# Patient Record
Sex: Female | Born: 1935 | Race: White | Hispanic: No | Smoking: Former smoker
Health system: Southern US, Community
[De-identification: ages and names within clinical notes are randomized; demographics above are authoritative.]

## PROBLEM LIST (undated history)

## (undated) DIAGNOSIS — F32A Depression, unspecified: Secondary | ICD-10-CM

## (undated) DIAGNOSIS — I10 Essential (primary) hypertension: Secondary | ICD-10-CM

## (undated) DIAGNOSIS — F329 Major depressive disorder, single episode, unspecified: Secondary | ICD-10-CM

## (undated) DIAGNOSIS — F0390 Unspecified dementia without behavioral disturbance: Secondary | ICD-10-CM

## (undated) DIAGNOSIS — M810 Age-related osteoporosis without current pathological fracture: Secondary | ICD-10-CM

## (undated) HISTORY — PX: FRACTURE SURGERY: SHX138

## (undated) HISTORY — PX: OTHER SURGICAL HISTORY: SHX169

---

## 2004-01-24 ENCOUNTER — Ambulatory Visit (HOSPITAL_COMMUNITY): Admission: RE | Admit: 2004-01-24 | Discharge: 2004-01-24 | Payer: Self-pay | Admitting: Ophthalmology

## 2010-10-19 ENCOUNTER — Other Ambulatory Visit: Payer: Self-pay | Admitting: Orthopedic Surgery

## 2010-10-19 DIAGNOSIS — M48061 Spinal stenosis, lumbar region without neurogenic claudication: Secondary | ICD-10-CM

## 2010-10-24 ENCOUNTER — Ambulatory Visit
Admission: RE | Admit: 2010-10-24 | Discharge: 2010-10-24 | Disposition: A | Discharge status: Home / Self Care | Payer: Medicare Other | Source: Ambulatory Visit | Attending: Orthopedic Surgery | Admitting: Orthopedic Surgery

## 2010-10-24 DIAGNOSIS — M48061 Spinal stenosis, lumbar region without neurogenic claudication: Secondary | ICD-10-CM

## 2011-01-02 ENCOUNTER — Other Ambulatory Visit: Payer: Self-pay | Admitting: Orthopedic Surgery

## 2011-01-23 ENCOUNTER — Inpatient Hospital Stay: Admission: RE | Admit: 2011-01-23 | Payer: Medicare Other | Source: Ambulatory Visit

## 2011-02-26 ENCOUNTER — Encounter (HOSPITAL_COMMUNITY)
Admission: RE | Disposition: A | Discharge status: Home / Self Care | Payer: Self-pay | Source: Ambulatory Visit | Attending: Ophthalmology

## 2011-02-26 ENCOUNTER — Encounter (HOSPITAL_COMMUNITY): Payer: Self-pay | Admitting: *Deleted

## 2011-02-26 ENCOUNTER — Ambulatory Visit (HOSPITAL_COMMUNITY)
Admission: RE | Admit: 2011-02-26 | Discharge: 2011-02-26 | Disposition: A | Discharge status: Home / Self Care | Payer: Medicare Other | Source: Ambulatory Visit | Attending: Ophthalmology | Admitting: Ophthalmology

## 2011-02-26 DIAGNOSIS — H26499 Other secondary cataract, unspecified eye: Secondary | ICD-10-CM | POA: Insufficient documentation

## 2011-02-26 DIAGNOSIS — Z79899 Other long term (current) drug therapy: Secondary | ICD-10-CM | POA: Insufficient documentation

## 2011-02-26 HISTORY — DX: Essential (primary) hypertension: I10

## 2011-02-26 HISTORY — DX: Major depressive disorder, single episode, unspecified: F32.9

## 2011-02-26 HISTORY — DX: Depression, unspecified: F32.A

## 2011-02-26 SURGERY — TREATMENT, USING YAG LASER
Anesthesia: LOCAL | Laterality: Left

## 2011-02-26 MED ORDER — TETRACAINE HCL 0.5 % OP SOLN
OPHTHALMIC | Status: AC
  Administered 2011-02-26: 1 [drp] via OPHTHALMIC
  Filled 2011-02-26: qty 2

## 2011-02-26 MED ORDER — APRACLONIDINE HCL 1 % OP SOLN
1.0000 [drp] | OPHTHALMIC | Status: AC
  Administered 2011-02-26 (×3): 1 [drp] via OPHTHALMIC

## 2011-02-26 MED ORDER — APRACLONIDINE HCL 0.5 % OP SOLN
OPHTHALMIC | Status: AC
  Filled 2011-02-26: qty 5

## 2011-02-26 MED ORDER — TETRACAINE HCL 0.5 % OP SOLN
1.0000 [drp] | OPHTHALMIC | Status: AC
  Administered 2011-02-26 (×2): 1 [drp] via OPHTHALMIC

## 2011-02-26 MED ORDER — CYCLOPENTOLATE-PHENYLEPHRINE 0.2-1 % OP SOLN
OPHTHALMIC | Status: AC
  Administered 2011-02-26: 1 [drp] via OPHTHALMIC
  Filled 2011-02-26: qty 2

## 2011-02-26 MED ORDER — CYCLOPENTOLATE-PHENYLEPHRINE 0.2-1 % OP SOLN
1.0000 [drp] | OPHTHALMIC | Status: AC
  Administered 2011-02-26 (×3): 1 [drp] via OPHTHALMIC

## 2011-02-26 NOTE — H&P (Signed)
See scanned office h&P

## 2011-02-26 NOTE — Procedures (Signed)
Dawn Kaufman 02/26/2011  [name]  Yag Laser Self Test Completedyes. Procedure: Posterior Capsulotomy, left eye.  Eye Protection Worn by Staff yes. Laser In Use Sign on Door yes.  Laser: Nd:YAG Spot Size: Fixed Burst Mode: III Power Setting:  1.7-2.2 mJ/burst  Number of shots: 74 Total energy delivered: 125 mJ  Patency of the peripheral iridotomy was confirmed visually.  The patient tolerated the procedure without difficulty. No complications were encountered.  Tenometer reading immediately after procedure: 19  mmHg.  The patient was discharged home with the instructions to continue all her current glaucoma medications, if any.   Patient instructed to go to office at 03/20/2011  @ 13:30 for intraocular pressure check.  Patient verbalizes understanding of discharge instructions yes.  Nurses Notes:heavily opacified posterior capsule noted and incised without complication but did require use of  laser lens due to dry spots on cornes

## 2015-03-09 ENCOUNTER — Inpatient Hospital Stay (HOSPITAL_COMMUNITY): Payer: Medicare Other

## 2015-03-09 ENCOUNTER — Observation Stay (HOSPITAL_COMMUNITY)
Admission: AD | Admit: 2015-03-09 | Discharge: 2015-03-11 | Disposition: A | Discharge status: Home / Self Care | Payer: Medicare Other | Source: Other Acute Inpatient Hospital | Attending: Internal Medicine | Admitting: Internal Medicine

## 2015-03-09 ENCOUNTER — Encounter (HOSPITAL_COMMUNITY): Payer: Self-pay | Admitting: Radiology

## 2015-03-09 DIAGNOSIS — E118 Type 2 diabetes mellitus with unspecified complications: Secondary | ICD-10-CM | POA: Diagnosis not present

## 2015-03-09 DIAGNOSIS — S72101A Unspecified trochanteric fracture of right femur, initial encounter for closed fracture: Secondary | ICD-10-CM | POA: Diagnosis present

## 2015-03-09 DIAGNOSIS — I10 Essential (primary) hypertension: Secondary | ICD-10-CM | POA: Diagnosis not present

## 2015-03-09 DIAGNOSIS — F17200 Nicotine dependence, unspecified, uncomplicated: Secondary | ICD-10-CM | POA: Insufficient documentation

## 2015-03-09 DIAGNOSIS — Z791 Long term (current) use of non-steroidal anti-inflammatories (NSAID): Secondary | ICD-10-CM | POA: Insufficient documentation

## 2015-03-09 DIAGNOSIS — S2249XA Multiple fractures of ribs, unspecified side, initial encounter for closed fracture: Secondary | ICD-10-CM

## 2015-03-09 DIAGNOSIS — S0990XA Unspecified injury of head, initial encounter: Secondary | ICD-10-CM | POA: Diagnosis not present

## 2015-03-09 DIAGNOSIS — E43 Unspecified severe protein-calorie malnutrition: Secondary | ICD-10-CM | POA: Insufficient documentation

## 2015-03-09 DIAGNOSIS — Y92481 Parking lot as the place of occurrence of the external cause: Secondary | ICD-10-CM | POA: Diagnosis not present

## 2015-03-09 DIAGNOSIS — E119 Type 2 diabetes mellitus without complications: Secondary | ICD-10-CM | POA: Insufficient documentation

## 2015-03-09 DIAGNOSIS — Z79899 Other long term (current) drug therapy: Secondary | ICD-10-CM | POA: Insufficient documentation

## 2015-03-09 DIAGNOSIS — Z681 Body mass index (BMI) 19 or less, adult: Secondary | ICD-10-CM | POA: Diagnosis not present

## 2015-03-09 DIAGNOSIS — S7291XA Unspecified fracture of right femur, initial encounter for closed fracture: Secondary | ICD-10-CM | POA: Diagnosis present

## 2015-03-09 DIAGNOSIS — S2231XA Fracture of one rib, right side, initial encounter for closed fracture: Secondary | ICD-10-CM | POA: Insufficient documentation

## 2015-03-09 DIAGNOSIS — W1809XA Striking against other object with subsequent fall, initial encounter: Secondary | ICD-10-CM | POA: Diagnosis not present

## 2015-03-09 DIAGNOSIS — F329 Major depressive disorder, single episode, unspecified: Secondary | ICD-10-CM | POA: Insufficient documentation

## 2015-03-09 DIAGNOSIS — F39 Unspecified mood [affective] disorder: Secondary | ICD-10-CM | POA: Diagnosis present

## 2015-03-09 DIAGNOSIS — Z96649 Presence of unspecified artificial hip joint: Secondary | ICD-10-CM

## 2015-03-09 DIAGNOSIS — S2241XA Multiple fractures of ribs, right side, initial encounter for closed fracture: Secondary | ICD-10-CM | POA: Diagnosis not present

## 2015-03-09 DIAGNOSIS — S61212D Laceration without foreign body of right middle finger without damage to nail, subsequent encounter: Secondary | ICD-10-CM

## 2015-03-09 DIAGNOSIS — M25551 Pain in right hip: Secondary | ICD-10-CM | POA: Diagnosis present

## 2015-03-09 DIAGNOSIS — S61212A Laceration without foreign body of right middle finger without damage to nail, initial encounter: Secondary | ICD-10-CM | POA: Diagnosis present

## 2015-03-09 DIAGNOSIS — S72111A Displaced fracture of greater trochanter of right femur, initial encounter for closed fracture: Principal | ICD-10-CM | POA: Insufficient documentation

## 2015-03-09 DIAGNOSIS — M978XXA Periprosthetic fracture around other internal prosthetic joint, initial encounter: Secondary | ICD-10-CM

## 2015-03-09 DIAGNOSIS — W19XXXA Unspecified fall, initial encounter: Secondary | ICD-10-CM

## 2015-03-09 DIAGNOSIS — Z96641 Presence of right artificial hip joint: Secondary | ICD-10-CM | POA: Diagnosis not present

## 2015-03-09 DIAGNOSIS — Z79891 Long term (current) use of opiate analgesic: Secondary | ICD-10-CM | POA: Insufficient documentation

## 2015-03-09 LAB — CBC WITH DIFFERENTIAL/PLATELET
BASOS PCT: 0 % (ref 0–1)
Basophils Absolute: 0 10*3/uL (ref 0.0–0.1)
EOS ABS: 0 10*3/uL (ref 0.0–0.7)
EOS PCT: 0 % (ref 0–5)
HCT: 38.6 % (ref 36.0–46.0)
Hemoglobin: 12.6 g/dL (ref 12.0–15.0)
Lymphocytes Relative: 14 % (ref 12–46)
Lymphs Abs: 1.4 10*3/uL (ref 0.7–4.0)
MCH: 31.4 pg (ref 26.0–34.0)
MCHC: 32.6 g/dL (ref 30.0–36.0)
MCV: 96.3 fL (ref 78.0–100.0)
MONO ABS: 0.9 10*3/uL (ref 0.1–1.0)
MONOS PCT: 9 % (ref 3–12)
Neutro Abs: 7.5 10*3/uL (ref 1.7–7.7)
Neutrophils Relative %: 77 % (ref 43–77)
Platelets: 209 10*3/uL (ref 150–400)
RBC: 4.01 MIL/uL (ref 3.87–5.11)
RDW: 13.3 % (ref 11.5–15.5)
WBC: 9.9 10*3/uL (ref 4.0–10.5)

## 2015-03-09 LAB — GLUCOSE, CAPILLARY: Glucose-Capillary: 135 mg/dL — ABNORMAL HIGH (ref 65–99)

## 2015-03-09 MED ORDER — CALCIUM CARBONATE-VITAMIN D 500-200 MG-UNIT PO TABS
1.0000 | ORAL_TABLET | Freq: Two times a day (BID) | ORAL | Status: DC
  Administered 2015-03-09 – 2015-03-11 (×4): 1 via ORAL
  Filled 2015-03-09 (×5): qty 1

## 2015-03-09 MED ORDER — INSULIN ASPART 100 UNIT/ML ~~LOC~~ SOLN
0.0000 [IU] | Freq: Four times a day (QID) | SUBCUTANEOUS | Status: DC
  Administered 2015-03-10: 1 [IU] via SUBCUTANEOUS
  Administered 2015-03-10: 2 [IU] via SUBCUTANEOUS
  Administered 2015-03-10: 1 [IU] via SUBCUTANEOUS
  Administered 2015-03-10: 2 [IU] via SUBCUTANEOUS
  Administered 2015-03-11: 3 [IU] via SUBCUTANEOUS

## 2015-03-09 MED ORDER — METHOCARBAMOL 1000 MG/10ML IJ SOLN
500.0000 mg | Freq: Four times a day (QID) | INTRAMUSCULAR | Status: DC | PRN
  Filled 2015-03-09: qty 5

## 2015-03-09 MED ORDER — SENNOSIDES-DOCUSATE SODIUM 8.6-50 MG PO TABS: 1.0000 | ORAL_TABLET | Freq: Every evening | ORAL | Status: DC | PRN

## 2015-03-09 MED ORDER — HYDROCODONE-ACETAMINOPHEN 5-325 MG PO TABS
1.0000 | ORAL_TABLET | Freq: Four times a day (QID) | ORAL | Status: DC | PRN
  Administered 2015-03-10 – 2015-03-11 (×4): 1 via ORAL
  Filled 2015-03-09 (×4): qty 1

## 2015-03-09 MED ORDER — LISINOPRIL 5 MG PO TABS
5.0000 mg | ORAL_TABLET | Freq: Every day | ORAL | Status: DC
  Filled 2015-03-09: qty 1

## 2015-03-09 MED ORDER — MORPHINE SULFATE (PF) 2 MG/ML IV SOLN
2.0000 mg | INTRAVENOUS | Status: DC | PRN
  Administered 2015-03-09 – 2015-03-10 (×3): 2 mg via INTRAVENOUS
  Filled 2015-03-09 (×3): qty 1

## 2015-03-09 MED ORDER — METHOCARBAMOL 500 MG PO TABS
500.0000 mg | ORAL_TABLET | Freq: Four times a day (QID) | ORAL | Status: DC | PRN
  Administered 2015-03-09 – 2015-03-11 (×3): 500 mg via ORAL
  Filled 2015-03-09 (×3): qty 1

## 2015-03-09 MED ORDER — ZOLPIDEM TARTRATE 5 MG PO TABS
5.0000 mg | ORAL_TABLET | Freq: Every evening | ORAL | Status: DC | PRN
  Administered 2015-03-10: 5 mg via ORAL
  Filled 2015-03-09: qty 1

## 2015-03-09 MED ORDER — VENLAFAXINE HCL ER 75 MG PO CP24
75.0000 mg | ORAL_CAPSULE | Freq: Two times a day (BID) | ORAL | Status: DC
  Administered 2015-03-09 – 2015-03-11 (×4): 75 mg via ORAL
  Filled 2015-03-09 (×5): qty 1

## 2015-03-09 MED ORDER — LORAZEPAM 0.5 MG PO TABS
0.5000 mg | ORAL_TABLET | Freq: Two times a day (BID) | ORAL | Status: DC | PRN
  Administered 2015-03-09 – 2015-03-10 (×2): 0.5 mg via ORAL
  Filled 2015-03-09 (×3): qty 1

## 2015-03-09 MED ORDER — PANTOPRAZOLE SODIUM 40 MG PO TBEC
40.0000 mg | DELAYED_RELEASE_TABLET | Freq: Every day | ORAL | Status: DC
  Administered 2015-03-10 – 2015-03-11 (×2): 40 mg via ORAL
  Filled 2015-03-09 (×2): qty 1

## 2015-03-09 NOTE — H&P (Signed)
[facility] History and Physical  Patient: Dawn Kaufman  MRN: [id number]  DOB: 1927-05-12  DOS: the patient was seen and examined on 03/09/2015 PCP: Kirstie Peri, MD  Referring physician: [facility] Chief Complaint: Fall  HPI: Dawn Kaufman is a 81 y.o. female with Past medical history of hypertension, diabetes, depression, right hip arthroplasty. The patient presents with complaints of a fall. The patient is a resident at independent living facility. When she was trying to walk she slipped over her slip on shoes and fell on the ground on the right side. This happened when she was with her daughter and another family member. She fell on the right side of the hip and also hit her right side of the face. She also had injury on the right middle finger. And she was complaining of some pain on the right side of the chest which was worsening with deep breathing. With this she was taken to [facility] and then from there she was brought here as she was found to be having a greater trochanteric fracture associated with periprosthetic hardware. She denies having any prior cardiac history stroke history no chest pain at the time of my evaluation no shortness of breath no fever no chills no diarrhea no nausea no vomiting no abdominal pain. She denies any focal deficit. There is no recent change in her medication. She has been having gait difficulties and has been using walker now to ambulate.  The patient is coming from independent living facility.  At her baseline ambulates with cane as well as walker And is independent for most of her ADL manages her medication on her own.  Review of Systems: as mentioned in the history of present illness.  A comprehensive review of the other systems is negative.  Past Medical History  Diagnosis Date  . Hypertension   . Depression    Past Surgical History  Procedure Laterality Date  . Cataract extraction both eyes    . Fracture  surgery     Social History:  reports that she has been smoking.  She does not have any smokeless tobacco history on file. She reports that she does not drink alcohol or use illicit drugs.  Allergies  Allergen Reactions  . Penicillins Swelling  . Latex   . Shellfish Allergy     Develops gout with frequent consumption of shellfish    No family history on file.  Prior to Admission medications   Medication Sig Start Date End Date Taking? Authorizing Provider  HYDROcodone-acetaminophen (LORTAB) 5-325 MG per tablet Take 1 tablet by mouth every 6 (six) hours as needed for moderate pain.   Yes Historical Provider, MD  lisinopril (PRINIVIL,ZESTRIL) 5 MG tablet Take 5 mg by mouth daily.     Yes Historical Provider, MD  LORazepam (ATIVAN) 0.5 MG tablet Take 0.5 mg by mouth 2 (two) times daily.   Yes Historical Provider, MD  metFORMIN (GLUCOPHAGE) 500 MG tablet Take by mouth 2 (two) times daily with a meal.   Yes Historical Provider, MD  omeprazole (PRILOSEC) 20 MG capsule Take 20 mg by mouth daily.   Yes Historical Provider, MD  triamterene-hydrochlorothiazide (DYAZIDE) 37.5-25 MG per capsule Take 1 capsule by mouth daily.   Yes Historical Provider, MD  ALPRAZolam Prudy Feeler) 0.25 MG tablet Take 1.25 mg by mouth at bedtime as needed. anxiety     Historical Provider, MD  ascorbic acid (C 500) 500 MG tablet Take 500 mg by mouth daily.      Historical  Provider, MD  calcium-vitamin D (OSCAL) 250-125 MG-UNIT per tablet Take 1 tablet by mouth 2 (two) times daily.      Historical Provider, MD  Multiple Vitamins-Minerals (MULTIVITAMIN WITH MINERALS) tablet Take 1 tablet by mouth daily.      Historical Provider, MD  Omega-3 Fat Ac-Cholecalciferol (MINICAPS VITAMIN-D/OMEGA-3) [phone] MG-UNIT CAPS Take 1 tablet by mouth daily.      Historical Provider, MD  venlafaxine (EFFEXOR-XR) 75 MG 24 hr capsule Take 75 mg by mouth 2 (two) times daily.      Historical Provider, MD    Physical Exam: Filed Vitals:    03/09/15 2119  BP: 151/62  Pulse: 70  Temp: 98.4 F (36.9 C)  TempSrc: Oral  Resp: 16  Height:  (1.626 m)  Weight: 44.906 kg (99 lb)  SpO2: 99%    General: Alert, Awake and Oriented to Time, Place and Person. Appear in mild distress Eyes: PERRL ENT: Oral Mucosa clear moist. Neck: no JVD Cardiovascular: S1 and S2 Present, no Murmur, Peripheral Pulses Present Respiratory: Bilateral Air entry equal and Decreased,  Clear to Auscultation, no Crackles, no wheezes Abdomen: Bowel Sound present, Soft and no tenderness Skin: no Rash Extremities: no Pedal edema, no calf tenderness Right middle finger wrapped  Neurologic: Grossly no focal neuro deficit.  Labs on Admission:  CBC:  Recent Labs Lab 03/09/15 2330  WBC 9.9  NEUTROABS 7.5  HGB 12.6  HCT 38.6  MCV 96.3  PLT 209      CMP     Component Value Date/Time   NA 138 03/09/2015 2330   K 4.5 03/09/2015 2330   CL 105 03/09/2015 2330   CO2 28 03/09/2015 2330   GLUCOSE 151* 03/09/2015 2330   BUN 26* 03/09/2015 2330   CREATININE 0.86 03/09/2015 2330   CALCIUM 9.1 03/09/2015 2330   PROT 6.1* 03/09/2015 2330   ALBUMIN 3.9 03/09/2015 2330   AST 22 03/09/2015 2330   ALT 13* 03/09/2015 2330   ALKPHOS 50 03/09/2015 2330   BILITOT 0.4 03/09/2015 2330   GFRNONAA 60* 03/09/2015 2330   GFRAA >60 03/09/2015 2330   Radiological Exams on Admission: X-ray of the hip and pelvis showing chronic left occlusion fracture and new right intertrochanteric fracture. Chest x-ray shows right seventh rib fracture.  Assessment/Plan Principal Problem:   Closed fracture of trochanter of right femur Active Problems:   Closed fracture of seven ribs of right side   Head injury   Laceration of right middle finger w/o foreign body w/o damage to nail   Diabetes mellitus, type 2   Mood disorder   1. Closed fracture of trochanter of right femur The patient is presenting with off fall. The following was deemed mechanical. The patient was  initially seen at [facility] and after initial x-ray as well as a repair of the laceration she was brought here for further workup. Currently I have discussed with Dr. Shon Baton who mentions Dr. Veda Canning will be available within the patient and potentially taking the patient or tomorrow. Patient will remain nothing by mouth after midnight.  2.A) Cardiac risk: Based on RCRI  The patient does not appear to have any significant risk factor other than her age.  With this the patient is a moderate risk for adverse Cardiac outcome from surgery. We will check preoperative EKG. Hold lisinopril and diuretics.  B) Pulmonary risk: Recommend postoperative incentive spirometry. Good pulmunary toilet.   C) General risk: Avoid major fluctuation in blood pressure intra-op and post operatively. Minimal sedation  and Narcotics.  Will request Surgeon to please Order Lovenox/DVT prophylaxis of his/her choice when OK from Surgeon's standpoint post op.   3.history of mood disorder. Continuing home medications. Family has mentioned that the patient has history of delirium postoperatively therefore please monitor.  4. diabetes mellitus. Holding metformin and placing the patient on sliding scale.  5. hypertension. Holding lisinopril as well as diuretic.  6. Fracture rib.  no evidence of hypoxia. Incentive spirometry postoperatively.   7. Head injury. Would check CT head to rule out any acute intracranial abnormality.  Advance goals of care discussion: Full code as per my discussion with patient. Patient's daughter will be her power of attorney.    Consults: orthopedics [place]   DVT Prophylaxis:mechanical compression device Nutrition: Nothing by mouth  Family Communication: family was present at bedside, opportunity was given to ask question and all questions were answered satisfactorily at the time of interview. Disposition: Admitted as inpatient, med-surge unit.  Author: Lynden Oxford, MD [facility] Pager: [phone] 03/09/2015  If 7PM-7AM, please contact night-coverage [email or web address] Password [email or web address]

## 2015-03-10 ENCOUNTER — Encounter (HOSPITAL_COMMUNITY): Payer: Self-pay | Admitting: *Deleted

## 2015-03-10 DIAGNOSIS — S72101A Unspecified trochanteric fracture of right femur, initial encounter for closed fracture: Secondary | ICD-10-CM

## 2015-03-10 DIAGNOSIS — S72111A Displaced fracture of greater trochanter of right femur, initial encounter for closed fracture: Secondary | ICD-10-CM | POA: Diagnosis not present

## 2015-03-10 DIAGNOSIS — F39 Unspecified mood [affective] disorder: Secondary | ICD-10-CM | POA: Diagnosis present

## 2015-03-10 DIAGNOSIS — S61212A Laceration without foreign body of right middle finger without damage to nail, initial encounter: Secondary | ICD-10-CM | POA: Diagnosis present

## 2015-03-10 DIAGNOSIS — E118 Type 2 diabetes mellitus with unspecified complications: Secondary | ICD-10-CM | POA: Diagnosis not present

## 2015-03-10 DIAGNOSIS — S2241XA Multiple fractures of ribs, right side, initial encounter for closed fracture: Secondary | ICD-10-CM | POA: Diagnosis present

## 2015-03-10 DIAGNOSIS — E119 Type 2 diabetes mellitus without complications: Secondary | ICD-10-CM

## 2015-03-10 DIAGNOSIS — S7291XA Unspecified fracture of right femur, initial encounter for closed fracture: Secondary | ICD-10-CM | POA: Diagnosis present

## 2015-03-10 DIAGNOSIS — E43 Unspecified severe protein-calorie malnutrition: Secondary | ICD-10-CM | POA: Insufficient documentation

## 2015-03-10 DIAGNOSIS — S0990XA Unspecified injury of head, initial encounter: Secondary | ICD-10-CM | POA: Diagnosis present

## 2015-03-10 LAB — COMPREHENSIVE METABOLIC PANEL
ALK PHOS: 50 U/L (ref 38–126)
ALT: 12 U/L — AB (ref 14–54)
ALT: 13 U/L — AB (ref 14–54)
AST: 23 U/L (ref 15–41)
AST: 22 U/L (ref 15–41)
Albumin: 3.5 g/dL (ref 3.5–5.0)
Albumin: 3.9 g/dL (ref 3.5–5.0)
Alkaline Phosphatase: 52 U/L (ref 38–126)
Anion gap: 5 (ref 5–15)
Anion gap: 8 (ref 5–15)
BILIRUBIN TOTAL: 0.4 mg/dL (ref 0.3–1.2)
BUN: 23 mg/dL — ABNORMAL HIGH (ref 6–20)
BUN: 26 mg/dL — AB (ref 6–20)
CALCIUM: 9.3 mg/dL (ref 8.9–10.3)
CALCIUM: 9.1 mg/dL (ref 8.9–10.3)
CHLORIDE: 102 mmol/L (ref 101–111)
CO2: 26 mmol/L (ref 22–32)
CO2: 28 mmol/L (ref 22–32)
CREATININE: 0.85 mg/dL (ref 0.44–1.00)
CREATININE: 0.86 mg/dL (ref 0.44–1.00)
Chloride: 105 mmol/L (ref 101–111)
GFR, EST NON AFRICAN AMERICAN: 60 mL/min — AB (ref >60)
Glucose, Bld: 108 mg/dL — ABNORMAL HIGH (ref 65–99)
Glucose, Bld: 151 mg/dL — ABNORMAL HIGH (ref 65–99)
Potassium: 4.4 mmol/L (ref 3.5–5.1)
Potassium: 4.5 mmol/L (ref 3.5–5.1)
Sodium: 136 mmol/L (ref 135–145)
Sodium: 138 mmol/L (ref 135–145)
TOTAL PROTEIN: 6.1 g/dL — AB (ref 6.5–8.1)
Total Bilirubin: 0.7 mg/dL (ref 0.3–1.2)
Total Protein: 6 g/dL — ABNORMAL LOW (ref 6.5–8.1)

## 2015-03-10 LAB — PREPARE RBC (CROSSMATCH)

## 2015-03-10 LAB — CBC WITH DIFFERENTIAL/PLATELET
Basophils Absolute: 0 10*3/uL (ref 0.0–0.1)
Basophils Relative: 0 % (ref 0–1)
EOS PCT: 0 % (ref 0–5)
Eosinophils Absolute: 0 10*3/uL (ref 0.0–0.7)
HCT: 36.7 % (ref 36.0–46.0)
Hemoglobin: 12 g/dL (ref 12.0–15.0)
Lymphocytes Relative: 18 % (ref 12–46)
Lymphs Abs: 1.5 10*3/uL (ref 0.7–4.0)
MCH: 31.5 pg (ref 26.0–34.0)
MCHC: 32.7 g/dL (ref 30.0–36.0)
MCV: 96.3 fL (ref 78.0–100.0)
Monocytes Absolute: 0.7 10*3/uL (ref 0.1–1.0)
Monocytes Relative: 9 % (ref 3–12)
NEUTROS ABS: 6.1 10*3/uL (ref 1.7–7.7)
NEUTROS PCT: 73 % (ref 43–77)
PLATELETS: 193 10*3/uL (ref 150–400)
RBC: 3.81 MIL/uL — AB (ref 3.87–5.11)
RDW: 13.5 % (ref 11.5–15.5)
WBC: 8.3 10*3/uL (ref 4.0–10.5)

## 2015-03-10 LAB — GLUCOSE, CAPILLARY
GLUCOSE-CAPILLARY: 144 mg/dL — AB (ref 65–99)
GLUCOSE-CAPILLARY: 131 mg/dL — AB (ref 65–99)
GLUCOSE-CAPILLARY: 168 mg/dL — AB (ref 65–99)
Glucose-Capillary: 89 mg/dL (ref 65–99)
Glucose-Capillary: 155 mg/dL — ABNORMAL HIGH (ref 65–99)

## 2015-03-10 LAB — ABO/RH: ABO/RH(D): A POS

## 2015-03-10 LAB — PROTIME-INR
INR: 0.98 (ref 0.00–1.49)
PROTHROMBIN TIME: 13.2 s (ref 11.6–15.2)

## 2015-03-10 LAB — APTT: aPTT: 28 seconds (ref 24–37)

## 2015-03-10 MED ORDER — BOOST / RESOURCE BREEZE PO LIQD
1.0000 | Freq: Two times a day (BID) | ORAL | Status: DC
  Administered 2015-03-11: 1 via ORAL

## 2015-03-10 MED ORDER — SODIUM CHLORIDE 0.9 % IV SOLN: Freq: Once | INTRAVENOUS | Status: DC

## 2015-03-10 NOTE — Discharge Instructions (Signed)
You may weight bear as tolerated with a walker NO active right hip abduction

## 2015-03-10 NOTE — Evaluation (Signed)
Occupational Therapy Evaluation Patient Details Name: Dawn Kaufman MRN: [id number] DOB: 1927/04/04 Today's Date: 03/10/2015    History of Present Illness RIGHT [place] [employer] periprosthetic femur fx with stable implant, nonoperative treatment   Clinical Impression   This 81 year old female was admitted for the above injury.  She will benefit from skilled OT to increase safety and independence with adls.  Pt currently needs up to total A +2 for safety.  Goals will focus on toilet transfers and standing for ADLs with min A and min guard levels respectively.  Will reinforce precautions also    Follow Up Recommendations  Supervision/Assistance - 24 hour;SNF (vs)    Equipment Recommendations  3 in 1 bedside comode    Recommendations for Other Services       Precautions / Restrictions Precautions Precautions: Posterior Hip Precaution Comments: no active hip abduction on R Restrictions Weight Bearing Restrictions: No RLE Weight Bearing: Weight bearing as tolerated      Mobility Bed Mobility Overal bed mobility: Needs Assistance;+2 for physical assistance Bed Mobility: Supine to Sit     Supine to sit: Max assist;+2 for physical assistance     General bed mobility comments: assist for bil LEs and trunk  Transfers Overall transfer level: Needs assistance Equipment used: Rolling walker (2 wheeled) Transfers: Sit to/from [employer] Sit to Stand: Mod assist;+2 safety/equipment Stand pivot transfers: Mod assist;+2 physical assistance;+2 safety/equipment       General transfer comment: multimodal cues, pt nervous about releasing walker; assist to extend RLE when sitting    Balance                                            ADL Overall ADL's : Needs assistance/impaired     Grooming: Set up;Supervision/safety;Sitting   Upper Body Bathing: Set up;Supervision/ safety;Sitting   Lower Body Bathing: Maximal assistance;+2 for  safety/equipment;Sit to/from stand   Upper Body Dressing : Set up;Sitting;Supervision/safety   Lower Body Dressing: Total assistance;+2 for safety/equipment;Sit to/from stand   Toilet Transfer: Moderate assistance;+2 for safety/equipment;BSC;RW;Stand-pivot   Toileting- Clothing Manipulation and Hygiene: Total assistance;+2 for safety/equipment;Sit to/from stand         General ADL Comments: SPT to [facility], +2 for safety.  Used platform as pt hurt R finger when she fell     Advice worker      Pertinent Vitals/Pain Pain Assessment: Faces Faces Pain Scale: Hurts little more Pain Location: R hip Pain Descriptors / Indicators: Aching Pain Intervention(s): Limited activity within patient's tolerance;Monitored during session;Premedicated before session;Repositioned;RN gave pain meds during session (IV medication)     Hand Dominance     Extremity/Trunk Assessment Upper Extremity Assessment Upper Extremity Assessment: Overall WFL for tasks assessed           Communication Communication Communication: No difficulties   Cognition Arousal/Alertness: Awake/alert Behavior During Therapy: WFL for tasks assessed/performed Overall Cognitive Status: Within Functional Limits for tasks assessed                     General Comments       Exercises       Shoulder Instructions      Home Living  Additional Comments: resides at Independent Living.      Prior Functioning/Environment Level of Independence: Independent             OT Diagnosis: Acute pain;Generalized weakness   OT Problem List: Decreased strength;Decreased activity tolerance;Pain;Decreased knowledge of precautions;Decreased knowledge of use of DME or AE   OT Treatment/Interventions: Self-care/ADL training;DME and/or AE instruction;Patient/family education    OT Goals(Current goals can be found in the care plan section) Acute Rehab  OT Goals Patient Stated Goal: decreased pain; get back to being independent OT Goal Formulation: With patient Time For Goal Achievement: 03/17/15 Potential to Achieve Goals: Good ADL Goals Pt Will Transfer to Toilet: with min assist;bedside commode;stand pivot transfer Additional ADL Goal #1: pt will recall 3/3 thps (posterior) and no abduction Additional ADL Goal #2: pt will maintain standing balance statically during adls with min guard for 2 minutes  OT Frequency: Min 2X/week   Barriers to D/C:            Co-evaluation              End of Session Nurse Communication: Mobility status (alarm pad incompatible with alarm box)  Activity Tolerance: Patient tolerated treatment well Patient left: in chair;with call bell/phone within reach;with family/visitor present (alarm pad incompatible with box:  alerted nursing)   Time: 0454-0981 OT Time Calculation (min): 43 min Charges:  OT General Charges $OT Visit: 1 Procedure OT Evaluation $Initial OT Evaluation Tier I: 1 Procedure G-Codes: OT G-codes **NOT FOR INPATIENT CLASS** Functional Assessment Tool Used: clinical observation/judgment Functional Limitation: Self care Self Care Current Status (X9147): At least 80 percent but less than 100 percent impaired, limited or restricted Self Care Goal Status (W2956): At least 20 percent but less than 40 percent impaired, limited or restricted  Geordan Xu 03/10/2015, 1:00 PM   Marica Otter, OTR/L [phone] 03/10/2015

## 2015-03-10 NOTE — Clinical Social Work Placement (Signed)
   CLINICAL SOCIAL WORK PLACEMENT  NOTE  Date:  03/10/2015  Patient Details  Name: Dawn Kaufman MRN: [id number] Date of Birth: 17-Jan-1927  Clinical Social Work is seeking post-discharge placement for this patient at the Skilled  Nursing Facility level of care (*CSW will initial, date and re-position this form in  chart as items are completed):  Yes   Patient/family provided with [place] Clinical Social Work Department's list of facilities offering this level of care within the geographic area requested by the patient (or if unable, by the patient's family).  Yes   Patient/family informed of their freedom to choose among providers that offer the needed level of care, that participate in Medicare, Medicaid or managed care program needed by the patient, have an available bed and are willing to accept the patient.  Yes   Patient/family informed of [facility]'s ownership interest in [facility] and [facility], as well as of the fact that they are under no obligation to receive care at these facilities.  PASRR submitted to EDS on       PASRR number received on       Existing PASRR number confirmed on 03/10/15     FL2 transmitted to all facilities in geographic area requested by pt/family on 03/10/15     FL2 transmitted to all facilities within larger geographic area on       Patient informed that his/her managed care company has contracts with or will negotiate with certain facilities, including the following:            Patient/family informed of bed offers received.  Patient chooses bed at       Physician recommends and patient chooses bed at      Patient to be transferred to   on  .  Patient to be transferred to facility by       Patient family notified on   of transfer.  Name of family member notified:        PHYSICIAN       Additional Comment:    _______________________________________________ Royetta Asal, LCSW  [phone] 03/10/2015, 1:48  PM

## 2015-03-10 NOTE — Progress Notes (Signed)
[facility] PROGRESS NOTE  SHANNA UN ZOX:[id number] DOB: July 15, 1926 DOA: 03/09/2015 PCP: Kirstie Peri, MD Interim summary: JEROLINE WOLBERT is a 80 y.o. female who complains of R hip pain after a mechanical fall.   Assessment/Plan: 1. Peri prosthetic fracture of the proximal right femur: Non operative, WBAT RLE with walker x12 weeks. NO ACTIVE HIP ABDUCTION Pain control requring IV pain medications.  and PT EVAL later today.    2. Hypertension: Controlled.  Plan to resume meds on discharge.    3. Diabetes Mellitus: CBG (last 3)   Recent Labs  03/09/15 2308 03/10/15 0551 03/10/15 0904  GLUCAP 135* 144* 89    Resume SSI.   Code Status: full code  Family Communication: none at bedside Disposition Plan: pending.    Consultants:  ORTHOPEDICS  Procedures:  none  Antibiotics:  none  HPI/Subjective: Pain not well  controlled.   Objective: Filed Vitals:   03/10/15 0518  BP: 113/51  Pulse: 67  Temp: 98.8 F (37.1 C)  Resp: 16    Intake/Output Summary (Last 24 hours) at 03/10/15 1223 Last data filed at 03/10/15 0519  Gross per 24 hour  Intake      0 ml  Output    200 ml  Net   -200 ml   Filed Weights   03/09/15 2119  Weight: 44.906 kg (99 lb)    Exam:   General:  Alert afebrile comfortable  Cardiovascular: s1s2   Respiratory: ctab  Abdomen: soft NT ND BS+  Musculoskeletal: no Pedal edema, RLE painful ROM.  Data Reviewed: Basic Metabolic Panel:  Recent Labs Lab 03/09/15 2330 03/10/15 0657  NA 138 136  K 4.5 4.4  CL 105 102  CO2 28 26  GLUCOSE 151* 108*  BUN 26* 23*  CREATININE 0.86 0.85  CALCIUM 9.1 9.3   Liver Function Tests:  Recent Labs Lab 03/09/15 2330 03/10/15 0657  AST 22 23  ALT 13* 12*  ALKPHOS 50 52  BILITOT 0.4 0.7  PROT 6.1* 6.0*  ALBUMIN 3.9 3.5   No results for input(s): LIPASE, AMYLASE in the last 168 hours. No results for input(s): AMMONIA in the last 168 hours. CBC:  Recent  Labs Lab 03/09/15 2330 03/10/15 0657  WBC 9.9 8.3  NEUTROABS 7.5 6.1  HGB 12.6 12.0  HCT 38.6 36.7  MCV 96.3 96.3  PLT 209 193   Cardiac Enzymes: No results for input(s): CKTOTAL, CKMB, CKMBINDEX, TROPONINI in the last 168 hours. BNP (last 3 results) No results for input(s): BNP in the last 8760 hours.  ProBNP (last 3 results) No results for input(s): PROBNP in the last 8760 hours.  CBG:  Recent Labs Lab 03/09/15 2308 03/10/15 0551 03/10/15 0904  GLUCAP 135* 144* 89    No results found for this or any previous visit (from the past 240 hour(s)).   Studies: Ct Head Wo Contrast  03/10/2015   CLINICAL DATA:  Status post fall. Hit concrete with right side of head. Initial encounter.  EXAM: CT HEAD WITHOUT CONTRAST  TECHNIQUE: Contiguous axial images were obtained from the base of the skull through the vertex without intravenous contrast.  COMPARISON:  Report from CT of the head performed 05/03/2011  FINDINGS: There is no evidence of acute infarction, mass lesion, or intra- or extra-axial hemorrhage on CT.  Dystrophic calcification is again noted adjacent to the frontal horn of the left lateral ventricle. Prominence of the ventricles and sulci reflects moderate cortical volume loss. Mild cerebellar atrophy is noted. Scattered periventricular and  subcortical white matter change likely reflects small vessel ischemic microangiopathy.  The brainstem and fourth ventricle are within normal limits. The basal ganglia are unremarkable in appearance. The cerebral hemispheres demonstrate grossly normal gray-white differentiation. No mass effect or midline shift is seen.  There is no evidence of fracture; visualized osseous structures are unremarkable in appearance. The orbits are within normal limits. The paranasal sinuses and mastoid air cells are well-aerated. No significant soft tissue abnormalities are seen.  IMPRESSION: 1. No evidence of traumatic intracranial injury or fracture. 2. Moderate  cortical volume loss and scattered small vessel ischemic microangiopathy. 3. Dystrophic calcification again noted adjacent to the frontal horn of the left lateral ventricle, grossly stable per report from prior CT.   Electronically Signed   By: Roanna Raider M.D.   On: 03/10/2015 00:30   Ct Femur Right Wo Contrast  03/10/2015   CLINICAL DATA:  Right anterior thigh pain after falling yesterday with inability to bear weight. Previous right hip arthroplasty. Initial encounter.  EXAM: CT OF THE RIGHT FEMUR WITHOUT CONTRAST  TECHNIQUE: Multidetector CT imaging was performed according to the standard protocol. Multiplanar CT image reconstructions were also generated.  COMPARISON:  Radiographs 03/09/2015 and 03/15/2009.  FINDINGS: The bones are demineralized. Patient is status post right hip hemiarthroplasty. As demonstrated radiographically, there is a comminuted and mildly displaced fracture through the greater trochanter. There is medial extension of a nondisplaced fracture through the intertrochanteric region of the proximal femur. No diaphyseal fracture extension or loosening of the femoral component is demonstrated. The distal femur is intact.  The hip is located. There is no evidence of acetabular fracture. There are diffuse degenerative changes at the knee meniscal chondrocalcinosis.  There is no large soft tissue hematoma. Scattered vascular calcifications are noted. Foley catheter is in place.  IMPRESSION: 1. Periprosthetic fracture of the proximal right femur with mildly displaced components involving the greater trochanter. 2. No diaphyseal extension of the fracture or loosening of the distal femoral component identified. No dislocation or pelvic fracture.   Electronically Signed   By: Carey Bullocks M.D.   On: 03/10/2015 08:35   Dg Hip Unilat With Pelvis 1v Right  03/10/2015   CLINICAL DATA:  Status post fall, with right hip pain. Initial encounter.  EXAM: DG HIP (WITH OR WITHOUT PELVIS) 1V RIGHT   COMPARISON:  Right hip radiographs performed earlier today at 1:48 p.m.  FINDINGS: As noted on recent right hip radiographs, there is a mildly displaced slightly comminuted fracture involving the right greater femoral trochanter, extending to the prosthesis. The prosthesis is grossly unremarkable in appearance.  Mild degenerative change is noted at the lower lumbar spine. There is a chronic avulsion fracture involving the left greater femoral trochanter. The visualized bowel gas pattern is grossly unremarkable.  IMPRESSION: 1. Mildly displaced slightly comminuted fracture again noted involving the right greater femoral trochanter, extending to the prosthesis. 2. Chronic avulsion fracture again noted involving the left greater femoral trochanter.   Electronically Signed   By: Roanna Raider M.D.   On: 03/10/2015 02:40   Dg Femur, Min 2 Views Right  03/10/2015   CLINICAL DATA:  Assess periprosthetic fracture of the right femur. Initial encounter.  EXAM: RIGHT FEMUR 2 VIEWS  COMPARISON:  None.  FINDINGS: There is a mildly comminuted mildly displaced fracture involving the right greater femoral trochanter, extending to the prosthesis. No additional fractures are noted along the femur. The femoral stem is grossly intact. Degenerative change is noted at the right knee joint,  with lateral compartment narrowing. No significant knee joint effusion is seen. No definite soft tissue abnormalities are characterized on radiograph.  IMPRESSION: 1. Mildly comminuted mildly displaced fracture involving the right greater femoral trochanter, extending to the prosthesis. 2. Osteoarthritis of the right knee, with lateral compartment narrowing.   Electronically Signed   By: Roanna Raider M.D.   On: 03/10/2015 02:42    Scheduled Meds: . sodium chloride   Intravenous Once  . calcium-vitamin D  1 tablet Oral BID  . feeding supplement  1 Container Oral BID BM  . insulin aspart  0-9 Units Subcutaneous 4 times per day  .  pantoprazole  40 mg Oral Daily  . venlafaxine XR  75 mg Oral BID   Continuous Infusions:   Principal Problem:   Closed fracture of trochanter of right femur Active Problems:   Closed fracture of seven ribs of right side   Head injury   Laceration of right middle finger w/o foreign body w/o damage to nail   Diabetes mellitus, type 2   Mood disorder   Femur fracture, right   Protein-calorie malnutrition, severe    Time spent: 25 min    Seana Underwood  [facility] Pager [phone] If 7PM-7AM, please contact night-coverage at [email or web address], password [facility] 03/10/2015, 12:23 PM  LOS: 1 day

## 2015-03-10 NOTE — Care Management Note (Deleted)
Case Management Note  Patient Details  Name: Dawn Kaufman MRN: [id number] Date of Birth: 12/06/1926  Subjective/Objective:                   RIGHT [place] AG periprosthetic femur fx with stable implant, amenable to nonoperative treatment Action/Plan: Discharge planning  Expected Discharge Date:                 Expected Discharge Plan:  Home w Home Health Services  In-House Referral:     Discharge planning Services  CM Consult  Post Acute Care Choice:  Home Health Choice offered to:  Patient, Adult Children  DME Arranged:  3-N-1 DME Agency:  [facility]  HH Arranged:  PT, OT, Nurse's Aide HH Agency:  [facility]  Status of Service:  In process, will continue to follow  Medicare Important Message Given:    Date Medicare IM Given:    Medicare IM give by:    Date Additional Medicare IM Given:    Additional Medicare Important Message give by:     If discussed at Long Length of Stay Meetings, dates discussed:    Additional Comments: CM spoke with pt and daughter,  [name], RN 03/10/2015, 12:33 PM

## 2015-03-10 NOTE — Progress Notes (Signed)
Initial Nutrition Assessment  DOCUMENTATION CODES:   Severe malnutrition in context of chronic illness, Underweight  INTERVENTION:  - Will order Boost Breeze BID, each supplement provides 250 kcal and 9 grams of protein - Will order Magic cup BID with meals, each supplement provides 290 kcal and 9 grams of protein - Encourage intake of protein sources - RD will continue to monitor for needs  NUTRITION DIAGNOSIS:   Inadequate oral intake related to poor appetite as evidenced by per patient/family report, meal completion < 50%.  GOAL:   Patient will meet greater than or equal to 90% of their needs  MONITOR:   PO intake, Supplement acceptance, Weight trends, Labs, I & O's  REASON FOR ASSESSMENT:   Other (Comment) (underweight BMI)  ASSESSMENT:   81 y.o. female with Past medical history of hypertension, diabetes, depression, right hip arthroplasty. Pt admitted s/p fall with R femur fx.  Pt seen for underweight BMI. Diet advanced this AM and pt ate 75% piece of toast and 1-2 pieces of fruit from fruit cup. She states that she lives at a retirement home and is provided meals there. She denies having an appetite this AM and complains of constant pain; lack of appetite could acutely be related to this.   Pt does not like Ensure or Boost but is willing to try Boost Breeze and [employer] as she likes juice and ice cream.   Unable to ask further questions at this time as PT awaiting ability to work with pt.   Severe muscle and fat wasting noted to upper body; did not assess legs due to fx. No prior weight available in chart for comparison.   Not meeting needs. Medications reviewed. Labs reviewed; CBGs: 89-144 mg/dL, BUN elevated.    Diet Order:  Diet Carb Modified Fluid consistency:: Thin; Room service appropriate?: Yes  Skin:  Wound (see comment) (R finger with sutures)  Last BM:  8/31  Height:   Ht Readings from Last 1 Encounters:  03/09/15  (1.626 m)    Weight:    Wt Readings from Last 1 Encounters:  03/09/15 99 lb (44.906 kg)    Ideal Body Weight:  54.54 kg (kg)  BMI:  Body mass index is 16.98 kg/(m^2).  Estimated Nutritional Needs:   Kcal:  1350-1550  Protein:  50-60 grams  Fluid:  2 L/day  EDUCATION NEEDS:   No education needs identified at this time     Trenton Gammon, RD, LDN Inpatient Clinical Dietitian Pager # [phone] After hours/weekend pager # [phone]

## 2015-03-10 NOTE — Clinical Social Work Note (Signed)
Clinical Social Work Assessment  Patient Details  Name: Dawn Kaufman MRN: [id number] Date of Birth: May 04, 1927  Date of referral:  03/10/15               Reason for consult:  Discharge Planning                Permission sought to share information with:  Facility Art therapist granted to share information::  Yes, Verbal Permission Granted  Name::        Agency::     Relationship::     Contact Information:     Housing/Transportation Living arrangements for the past 2 months:  Apartment Source of Information:    Patient Interpreter Needed:  None Criminal Activity/Legal Involvement Pertinent to Current Situation/Hospitalization:  No - Comment as needed Significant Relationships:  Adult Children Lives with:  Self Do you feel safe going back to the place where you live?   (SNF recommended) Need for family participation in patient care:  Yes (Comment)  Care giving concerns:  Pt needs more care than family can provide at home following hospital d/c.   Social Worker assessment / plan:  Pt hospitalized on 03/09/15 with a non surgery required hip fx. She resides in an Independent living community. CSW met with pt / daughter/ MD / RNCM to assist with d/c planning. PT has recommended SNF placement. Recommendations reviewed with pt / daughter. Pt has traditional medicare which will not cover SNF placement due to pt's Observation status. Pt's daughter will discuss PT recommendtions with other family members and contact CSW with disposition decision. CSW has initiated a pvt pay SNF search and will assist with d/c planning to SNF if this is pt/ family's choice. RNCM has provided family with pvt pay resources.   Employment status:  Retired Forensic scientist:  Medicare PT Recommendations:  [place] / Referral to community resources:  [place]  Patient/Family's Response to care:  Pt / family are upset that insurance will not cover  SNF placement.   Patient/Family's Understanding of and Emotional Response to Diagnosis, Current Treatment, and Prognosis:  Pt / daughter have a good understanding of pt's medical status. They are pleased that surgery is not required. Pt / daughter are struggling with options for a safe disposition. Pt does not qualify for medicaid.   Emotional Assessment Appearance:  Appears stated age Attitude/Demeanor/Rapport:  Other (cooperative) Affect (typically observed):  Accepting, Quiet Orientation:  Oriented to Self, Oriented to Place, Oriented to  Time, Oriented to Situation Alcohol / Substance use:  Not Applicable Psych involvement (Current and /or in the community):  No (Comment)  Discharge Needs  Concerns to be addressed:  Discharge Planning Concerns Readmission within the last 30 days:  No Current discharge risk:  None Barriers to Discharge:  No Barriers Identified   Luretha Rued, [place] 03/10/2015, 1:01 PM

## 2015-03-10 NOTE — Care Management Note (Signed)
Case Management Note  Patient Details  Name: Dawn Kaufman MRN: [id number] Date of Birth: 04-Nov-1926  Subjective/Objective:                   RIGHT [place] AG periprosthetic femur fx with stable implant, amenable to nonoperative treatment Action/Plan:  Discharge planning Expected Discharge Date:                 Expected Discharge Plan:  [place]  In-House Referral:     Discharge planning Services  CM Consult  Post Acute Care Choice:  Home Health Choice offered to:  Patient, Adult Children  DME Arranged:  3-N-1 DME Agency:  [place] Arranged:  PT, OT, Nurse's Aide [place] Agency:  [facility]  Status of Service:  In process, will continue to follow  Medicare Important Message Given:    Date Medicare IM Given:    Medicare IM give by:    Date Additional Medicare IM Given:    Additional Medicare Important Message give by:     If discussed at [place] of Stay Meetings, dates discussed:    Additional Comments: CM met with pt and daughter, Dawn Kaufman in room to discuss observation status of pt.  CM gave pt (daughter signed) Condition Code 62, placed signed copy in shadow chart and placed copy in CM file.  Pt and daughter verbalized understanding SNF will not be an insurance covered expense; private duty list given to family and they also verbalized understanding this would not be an insurance covered expense.  Family understands HHPT/OT/aide will be covered by insurance along with needed 3n1 (pt has rolling walker at home) if they decide to go home with home health vs paying out of pocket for SNF.  CM has requested orders for HHPT/OT/aide and 3n1.  Referral called to [facility] rep, Kristen who will follow for disposition. Dellie Catholic, RN 03/10/2015, 12:34 PM

## 2015-03-10 NOTE — Evaluation (Signed)
Physical Therapy Evaluation Patient Details Name: Dawn Kaufman MRN: [id number] DOB: 10/06/1926 Today's Date: 03/10/2015   History of Present Illness  RIGHT [place] AG periprosthetic femur fx with stable implant, amenable to nonoperative treatment  Clinical Impression  Patient is requiring extensive assist for mobility , used a Platform on  The R arm but  Will not need it, had an injury to the middle finger.. Patient will benefit from PT to address problems listed in not below.     Follow Up Recommendations SNF;Supervision/Assistance - 24 hour    Equipment Recommendations  Rolling walker with 5" wheels    Recommendations for Other Services       Precautions / Restrictions Precautions Precautions: Posterior Hip Precaution Comments: no active hip abduction on R Restrictions Weight Bearing Restrictions: No RLE Weight Bearing: Weight bearing as tolerated      Mobility  Bed Mobility Overal bed mobility: Needs Assistance;+2 for physical assistance Bed Mobility: Supine to Sit     Supine to sit: Max assist;+2 for physical assistance     General bed mobility comments: assist for bil LEs and trunk  Transfers Overall transfer level: Needs assistance Equipment used: Rolling walker (2 wheeled) Transfers: Sit to/from [employer] Sit to Stand: Mod assist;+2 physical assistance;+2 safety/equipment Stand pivot transfers: Mod assist;+2 physical assistance;+2 safety/equipment       General transfer comment: multimodal cues, pt nervous about releasing walker; assist to extend RLE when sitting, moved from bed to [facility] to  recliner  Ambulation/Gait                Stairs            Wheelchair Mobility    Modified Rankin (Stroke Patients Only)       Balance Overall balance assessment: History of Falls;Needs assistance Sitting-balance support: Bilateral upper extremity supported;Feet supported Sitting balance-Leahy Scale: Fair     Standing  balance support: Bilateral upper extremity supported Standing balance-Leahy Scale: Poor                               Pertinent Vitals/Pain Pain Assessment: Faces Faces Pain Scale: Hurts little more Pain Location: R hip Pain Descriptors / Indicators: Aching;Stabbing Pain Intervention(s): Limited activity within patient's tolerance;RN gave pain meds during session    Home Living                   Additional Comments: resides at Independent Living.    Prior Function Level of Independence: Independent               Hand Dominance        Extremity/Trunk Assessment   Upper Extremity Assessment: Overall WFL for tasks assessed           Lower Extremity Assessment: RLE deficits/detail RLE Deficits / Details: assisted to move leg to edge       Communication   Communication: No difficulties  Cognition Arousal/Alertness: Awake/alert Behavior During Therapy: WFL for tasks assessed/performed Overall Cognitive Status: History of cognitive impairments - at baseline Area of Impairment: Following commands;Awareness     Memory: Decreased recall of precautions Following Commands: Follows one step commands inconsistently            General Comments      Exercises        Assessment/Plan    PT Assessment Patient needs continued PT services  PT Diagnosis Difficulty walking;Generalized weakness;Acute pain;Altered mental status   PT Problem  List Decreased strength;Decreased activity tolerance;Decreased mobility;Decreased knowledge of use of DME;Decreased knowledge of precautions;Cardiopulmonary status limiting activity;Pain  PT Treatment Interventions DME instruction;Gait training;Functional mobility training;Therapeutic activities;Patient/family education;Therapeutic exercise   PT Goals (Current goals can be found in the Care Plan section) Acute Rehab PT Goals Patient Stated Goal: decreased pain; get back to being independent PT Goal Formulation:  With patient/family Time For Goal Achievement: 03/24/15 Potential to Achieve Goals: Good    Frequency Min 3X/week   Barriers to discharge Decreased caregiver support      Co-evaluation               End of Session Equipment Utilized During Treatment: Gait belt Activity Tolerance: No increased pain Patient left: in chair;with call bell/phone within reach;with family/visitor present Nurse Communication: Mobility status    Functional Assessment Tool Used: clinical judgement Functional Limitation: Mobility: Walking and moving around Mobility: Walking and Moving Around Current Status [phone]): At least 60 percent but less than 80 percent impaired, limited or restricted Mobility: Walking and Moving Around Goal Status [phone]): At least 1 percent but less than 20 percent impaired, limited or restricted    Time: 1202-1237 PT Time Calculation (min) (ACUTE ONLY): 35 min   Charges:   PT Evaluation $Initial PT Evaluation Tier I: 1 Procedure     PT G Codes:   PT G-Codes **NOT FOR INPATIENT CLASS** Functional Assessment Tool Used: clinical judgement Functional Limitation: Mobility: Walking and moving around Mobility: Walking and Moving Around Current Status (N0272): At least 60 percent but less than 80 percent impaired, limited or restricted Mobility: Walking and Moving Around Goal Status [phone]): At least 1 percent but less than 20 percent impaired, limited or restricted    Rada Hay 03/10/2015, 1:48 PM

## 2015-03-10 NOTE — Consult Note (Signed)
ORTHOPAEDIC CONSULTATION  REQUESTING PHYSICIAN: Kathlen Mody, MD  PCP:  Kirstie Peri, MD  Chief Complaint: R hip pain  HPI: Dawn Kaufman is a 81 y.o. female who complains of  R hip pain. Tripped on her shoe yesterday in a parking lot, and fell onto right hip. H/o R hip hemiarthroplasty in 2008 with GT fracture. Has been doing well recently; ambulates with a cane. No hip pain prior to yesterday's fall.   Past Medical History  Diagnosis Date  . Hypertension   . Depression    Past Surgical History  Procedure Laterality Date  . Cataract extraction both eyes    . Fracture surgery     Social History   Social History  . Marital Status: Widowed    Spouse Name: N/A  . Number of Children: N/A  . Years of Education: N/A   Social History Main Topics  . Smoking status: Current Some Day Smoker  . Smokeless tobacco: None  . Alcohol Use: No  . Drug Use: No  . Sexual Activity: Not Asked   Other Topics Concern  . None   Social History Narrative   No family history on file. Allergies  Allergen Reactions  . Penicillins Swelling  . Latex Hives  . Shellfish Allergy     Develops gout with frequent consumption of shellfish   Prior to Admission medications   Medication Sig Start Date End Date Taking? Authorizing Provider  ascorbic acid (C 500) 500 MG tablet Take 500 mg by mouth daily.     Yes Historical Provider, MD  calcium-vitamin D (OSCAL) 250-125 MG-UNIT per tablet Take 1 tablet by mouth 2 (two) times daily.     Yes Historical Provider, MD  HYDROcodone-acetaminophen (LORTAB) 5-325 MG per tablet Take 1 tablet by mouth every 6 (six) hours as needed for moderate pain.   Yes Historical Provider, MD  ibuprofen (ADVIL,MOTRIN) 200 MG tablet Take 400 mg by mouth every 6 (six) hours as needed for mild pain.   Yes Historical Provider, MD  lisinopril (PRINIVIL,ZESTRIL) 5 MG tablet Take 5 mg by mouth daily.     Yes Historical Provider, MD  LORazepam (ATIVAN) 0.5 MG tablet Take 0.5 mg by  mouth 2 (two) times daily as needed for anxiety or sleep.    Yes Historical Provider, MD  metFORMIN (GLUCOPHAGE) 500 MG tablet Take by mouth 2 (two) times daily with a meal.   Yes Historical Provider, MD  Multiple Vitamins-Minerals (MULTIVITAMIN WITH MINERALS) tablet Take 1 tablet by mouth daily.     Yes Historical Provider, MD  Omega-3 Fat Ac-Cholecalciferol (MINICAPS VITAMIN-D/OMEGA-3) [phone] MG-UNIT CAPS Take 1 tablet by mouth daily.     Yes Historical Provider, MD  omeprazole (PRILOSEC) 20 MG capsule Take 20 mg by mouth daily.   Yes Historical Provider, MD  triamterene-hydrochlorothiazide (DYAZIDE) 37.5-25 MG per capsule Take 0.25 capsules by mouth daily as needed (fluid).    Yes Historical Provider, MD  venlafaxine (EFFEXOR-XR) 75 MG 24 hr capsule Take 75 mg by mouth 2 (two) times daily.     Yes Historical Provider, MD   Ct Head Wo Contrast  03/10/2015   CLINICAL DATA:  Status post fall. Hit concrete with right side of head. Initial encounter.  EXAM: CT HEAD WITHOUT CONTRAST  TECHNIQUE: Contiguous axial images were obtained from the base of the skull through the vertex without intravenous contrast.  COMPARISON:  Report from CT of the head performed 05/03/2011  FINDINGS: There is no evidence of acute infarction, mass lesion, or intra-  or extra-axial hemorrhage on CT.  Dystrophic calcification is again noted adjacent to the frontal horn of the left lateral ventricle. Prominence of the ventricles and sulci reflects moderate cortical volume loss. Mild cerebellar atrophy is noted. Scattered periventricular and subcortical white matter change likely reflects small vessel ischemic microangiopathy.  The brainstem and fourth ventricle are within normal limits. The basal ganglia are unremarkable in appearance. The cerebral hemispheres demonstrate grossly normal gray-white differentiation. No mass effect or midline shift is seen.  There is no evidence of fracture; visualized osseous structures are unremarkable  in appearance. The orbits are within normal limits. The paranasal sinuses and mastoid air cells are well-aerated. No significant soft tissue abnormalities are seen.  IMPRESSION: 1. No evidence of traumatic intracranial injury or fracture. 2. Moderate cortical volume loss and scattered small vessel ischemic microangiopathy. 3. Dystrophic calcification again noted adjacent to the frontal horn of the left lateral ventricle, grossly stable per report from prior CT.   Electronically Signed   By: Roanna Raider M.D.   On: 03/10/2015 00:30   Dg Hip Unilat With Pelvis 1v Right  03/10/2015   CLINICAL DATA:  Status post fall, with right hip pain. Initial encounter.  EXAM: DG HIP (WITH OR WITHOUT PELVIS) 1V RIGHT  COMPARISON:  Right hip radiographs performed earlier today at 1:48 p.m.  FINDINGS: As noted on recent right hip radiographs, there is a mildly displaced slightly comminuted fracture involving the right greater femoral trochanter, extending to the prosthesis. The prosthesis is grossly unremarkable in appearance.  Mild degenerative change is noted at the lower lumbar spine. There is a chronic avulsion fracture involving the left greater femoral trochanter. The visualized bowel gas pattern is grossly unremarkable.  IMPRESSION: 1. Mildly displaced slightly comminuted fracture again noted involving the right greater femoral trochanter, extending to the prosthesis. 2. Chronic avulsion fracture again noted involving the left greater femoral trochanter.   Electronically Signed   By: Roanna Raider M.D.   On: 03/10/2015 02:40   Dg Femur, Min 2 Views Right  03/10/2015   CLINICAL DATA:  Assess periprosthetic fracture of the right femur. Initial encounter.  EXAM: RIGHT FEMUR 2 VIEWS  COMPARISON:  None.  FINDINGS: There is a mildly comminuted mildly displaced fracture involving the right greater femoral trochanter, extending to the prosthesis. No additional fractures are noted along the femur. The femoral stem is grossly  intact. Degenerative change is noted at the right knee joint, with lateral compartment narrowing. No significant knee joint effusion is seen. No definite soft tissue abnormalities are characterized on radiograph.  IMPRESSION: 1. Mildly comminuted mildly displaced fracture involving the right greater femoral trochanter, extending to the prosthesis. 2. Osteoarthritis of the right knee, with lateral compartment narrowing.   Electronically Signed   By: Roanna Raider M.D.   On: 03/10/2015 02:42    Positive ROS: All other systems have been reviewed and were otherwise negative with the exception of those mentioned in the HPI and as above.  Physical Exam: General: Alert, no acute distress Cardiovascular: No pedal edema Respiratory: No cyanosis, no use of accessory musculature GI: No organomegaly, abdomen is soft and non-tender Skin: No lesions in the area of chief complaint Neurologic: Sensation intact distally Psychiatric: Patient is competent for consent with normal mood and affect Lymphatic: No axillary or cervical lymphadenopathy  MUSCULOSKELETAL: RLE: incis healed. Mild pain with log roll. Able to SLR. NVI.  Assessment: RIGHT [place] AG periprosthetic femur fx with stable implant, amenable to nonoperative treatment  Plan: WBAT RLE  with walker x12 weeks NO ACTIVE HIP ABDUCTION Posterior hip precautions PT/OT PO pain control Dispo: d/c planning    Garnet Koyanagi, MD Cell [phone]    03/10/2015 7:22 AM

## 2015-03-11 ENCOUNTER — Other Ambulatory Visit: Payer: Self-pay

## 2015-03-11 DIAGNOSIS — S72101D Unspecified trochanteric fracture of right femur, subsequent encounter for closed fracture with routine healing: Secondary | ICD-10-CM

## 2015-03-11 DIAGNOSIS — S72111A Displaced fracture of greater trochanter of right femur, initial encounter for closed fracture: Secondary | ICD-10-CM | POA: Diagnosis not present

## 2015-03-11 DIAGNOSIS — E118 Type 2 diabetes mellitus with unspecified complications: Secondary | ICD-10-CM | POA: Diagnosis not present

## 2015-03-11 LAB — CBC
HCT: 35.7 % — ABNORMAL LOW (ref 36.0–46.0)
HEMOGLOBIN: 11.6 g/dL — AB (ref 12.0–15.0)
MCH: 31 pg (ref 26.0–34.0)
MCHC: 32.5 g/dL (ref 30.0–36.0)
MCV: 95.5 fL (ref 78.0–100.0)
Platelets: 179 10*3/uL (ref 150–400)
RBC: 3.74 MIL/uL — AB (ref 3.87–5.11)
RDW: 13.3 % (ref 11.5–15.5)
WBC: 9 10*3/uL (ref 4.0–10.5)

## 2015-03-11 LAB — VITAMIN D 25 HYDROXY (VIT D DEFICIENCY, FRACTURES): VIT D 25 HYDROXY: 56.8 ng/mL (ref 30.0–100.0)

## 2015-03-11 LAB — GLUCOSE, CAPILLARY
GLUCOSE-CAPILLARY: 242 mg/dL — AB (ref 65–99)
GLUCOSE-CAPILLARY: 125 mg/dL — AB (ref 65–99)

## 2015-03-11 MED ORDER — BOOST / RESOURCE BREEZE PO LIQD: 1.0000 | Freq: Two times a day (BID) | ORAL | Status: AC

## 2015-03-11 MED ORDER — LISINOPRIL 5 MG PO TABS
5.0000 mg | ORAL_TABLET | Freq: Every day | ORAL | Status: DC
  Administered 2015-03-11: 5 mg via ORAL
  Filled 2015-03-11: qty 1

## 2015-03-11 MED ORDER — HYDROCODONE-ACETAMINOPHEN 5-325 MG PO TABS: 1.0000 | ORAL_TABLET | Freq: Four times a day (QID) | ORAL | Status: DC | PRN

## 2015-03-11 MED ORDER — SENNOSIDES-DOCUSATE SODIUM 8.6-50 MG PO TABS: 1.0000 | ORAL_TABLET | Freq: Every evening | ORAL | Status: DC | PRN

## 2015-03-11 NOTE — Progress Notes (Signed)
Pt d/c to [facility]. Transported by [facility]. Daughter notified, per her request, of patient's pickup time.

## 2015-03-11 NOTE — Discharge Summary (Signed)
Physician Discharge Summary  Dawn Kaufman:[id number] DOB: 02-05-1927 DOA: 03/09/2015  PCP: Kirstie Peri, MD  Admit date: 03/09/2015 Discharge date: 03/11/2015  Time spent: 25 minutes  Recommendations for Outpatient Follow-up:  1. Follow up with orthopedics as recommended.   Discharge Diagnoses:  Principal Problem:   Closed fracture of trochanter of right femur Active Problems:   Closed fracture of seven ribs of right side   Head injury   Laceration of right middle finger w/o foreign body w/o damage to nail   Diabetes mellitus, type 2   Mood disorder   Femur fracture, right   Protein-calorie malnutrition, severe   Discharge Condition: improved  Diet recommendation: low sodium diet.   Filed Weights   03/09/15 2119  Weight: 44.906 kg (99 lb)    History of present illness:  Dawn Kaufman is a 82 y.o. female who complains of R hip pain after a mechanical fall.   Hospital Course:  1. Peri prosthetic fracture of the proximal right femur: Non operative, WBAT RLE with walker x12 weeks. NO ACTIVE HIP ABDUCTION Pain control. and PT EVAL recommended SNF.   2. Hypertension:  sub optimal Controlled.  Plan to resume meds on discharge.    3. Diabetes Mellitus: CBG (last 3)   Recent Labs (last 2 labs)      Recent Labs  03/09/15 2308 03/10/15 0551 03/10/15 0904  GLUCAP 135* 144* 89      Resume metformin.       Procedures:  none  Consultations:  Orthopedics.   Discharge Exam: Filed Vitals:   03/11/15 0517  BP: 160/62  Pulse: 84  Temp: 97.5 F (36.4 C)  Resp: 16    General: ALERT afebrile comfortable Cardiovascular: s1s2 Respiratory: ctab  Discharge Instructions   Discharge Instructions    Diet - low sodium heart healthy    Complete by:  As directed      Discharge instructions    Complete by:  As directed   Follow up with PCP IN ONE WEEK.          Current Discharge Medication List    START taking these medications    Details  feeding supplement (BOOST / RESOURCE BREEZE) LIQD Take 1 Container by mouth 2 (two) times daily between meals. Refills: 0    senna-docusate (SENOKOT-S) 8.6-50 MG per tablet Take 1 tablet by mouth at bedtime as needed for mild constipation.      CONTINUE these medications which have NOT CHANGED   Details  ascorbic acid (C 500) 500 MG tablet Take 500 mg by mouth daily.      calcium-vitamin D (OSCAL) 250-125 MG-UNIT per tablet Take 1 tablet by mouth 2 (two) times daily.      HYDROcodone-acetaminophen (LORTAB) 5-325 MG per tablet Take 1 tablet by mouth every 6 (six) hours as needed for moderate pain.    lisinopril (PRINIVIL,ZESTRIL) 5 MG tablet Take 5 mg by mouth daily.      LORazepam (ATIVAN) 0.5 MG tablet Take 0.5 mg by mouth 2 (two) times daily as needed for anxiety or sleep.     metFORMIN (GLUCOPHAGE) 500 MG tablet Take by mouth 2 (two) times daily with a meal.    Multiple Vitamins-Minerals (MULTIVITAMIN WITH MINERALS) tablet Take 1 tablet by mouth daily.      Omega-3 Fat Ac-Cholecalciferol (MINICAPS VITAMIN-D/OMEGA-3) [phone] MG-UNIT CAPS Take 1 tablet by mouth daily.      omeprazole (PRILOSEC) 20 MG capsule Take 20 mg by mouth daily.    triamterene-hydrochlorothiazide (DYAZIDE) 37.5-25  MG per capsule Take 0.25 capsules by mouth daily as needed (fluid).     venlafaxine (EFFEXOR-XR) 75 MG 24 hr capsule Take 75 mg by mouth 2 (two) times daily.        STOP taking these medications     ibuprofen (ADVIL,MOTRIN) 200 MG tablet        Allergies  Allergen Reactions  . Penicillins Swelling  . Latex Hives  . Shellfish Allergy     Develops gout with frequent consumption of shellfish   Follow-up Information    Follow up with Swinteck, Cloyde Reams, MD. Schedule an appointment as soon as possible for a visit in 2 weeks.   Specialty:  Orthopedic Surgery   Contact information:   [address] [place] [address] [phone]       Follow up with  [facility], MD. Schedule an appointment as soon as possible for a visit in 1 week.   Specialty:  Internal Medicine   Contact information:   [address] [place] [address] [phone]        The results of significant diagnostics from this hospitalization (including imaging, microbiology, ancillary and laboratory) are listed below for reference.    Significant Diagnostic Studies: Ct Head Wo Contrast  03/10/2015   CLINICAL DATA:  Status post fall. Hit concrete with right side of head. Initial encounter.  EXAM: CT HEAD WITHOUT CONTRAST  TECHNIQUE: Contiguous axial images were obtained from the base of the skull through the vertex without intravenous contrast.  COMPARISON:  Report from CT of the head performed 05/03/2011  FINDINGS: There is no evidence of acute infarction, mass lesion, or intra- or extra-axial hemorrhage on CT.  Dystrophic calcification is again noted adjacent to the frontal horn of the left lateral ventricle. Prominence of the ventricles and sulci reflects moderate cortical volume loss. Mild cerebellar atrophy is noted. Scattered periventricular and subcortical white matter change likely reflects small vessel ischemic microangiopathy.  The brainstem and fourth ventricle are within normal limits. The basal ganglia are unremarkable in appearance. The cerebral hemispheres demonstrate grossly normal gray-white differentiation. No mass effect or midline shift is seen.  There is no evidence of fracture; visualized osseous structures are unremarkable in appearance. The orbits are within normal limits. The paranasal sinuses and mastoid air cells are well-aerated. No significant soft tissue abnormalities are seen.  IMPRESSION: 1. No evidence of traumatic intracranial injury or fracture. 2. Moderate cortical volume loss and scattered small vessel ischemic microangiopathy. 3. Dystrophic calcification again noted adjacent to the frontal horn of the left lateral ventricle, grossly stable per report  from prior CT.   Electronically Signed   By: Roanna Raider M.D.   On: 03/10/2015 00:30   Ct Femur Right Wo Contrast  03/10/2015   CLINICAL DATA:  Right anterior thigh pain after falling yesterday with inability to bear weight. Previous right hip arthroplasty. Initial encounter.  EXAM: CT OF THE RIGHT FEMUR WITHOUT CONTRAST  TECHNIQUE: Multidetector CT imaging was performed according to the standard protocol. Multiplanar CT image reconstructions were also generated.  COMPARISON:  Radiographs 03/09/2015 and 03/15/2009.  FINDINGS: The bones are demineralized. Patient is status post right hip hemiarthroplasty. As demonstrated radiographically, there is a comminuted and mildly displaced fracture through the greater trochanter. There is medial extension of a nondisplaced fracture through the intertrochanteric region of the proximal femur. No diaphyseal fracture extension or loosening of the femoral component is demonstrated. The distal femur is intact.  The hip is located. There is no evidence of  acetabular fracture. There are diffuse degenerative changes at the knee meniscal chondrocalcinosis.  There is no large soft tissue hematoma. Scattered vascular calcifications are noted. Foley catheter is in place.  IMPRESSION: 1. Periprosthetic fracture of the proximal right femur with mildly displaced components involving the greater trochanter. 2. No diaphyseal extension of the fracture or loosening of the distal femoral component identified. No dislocation or pelvic fracture.   Electronically Signed   By: Carey Bullocks M.D.   On: 03/10/2015 08:35   Dg Hip Unilat With Pelvis 1v Right  03/10/2015   CLINICAL DATA:  Status post fall, with right hip pain. Initial encounter.  EXAM: DG HIP (WITH OR WITHOUT PELVIS) 1V RIGHT  COMPARISON:  Right hip radiographs performed earlier today at 1:48 p.m.  FINDINGS: As noted on recent right hip radiographs, there is a mildly displaced slightly comminuted fracture involving the right  greater femoral trochanter, extending to the prosthesis. The prosthesis is grossly unremarkable in appearance.  Mild degenerative change is noted at the lower lumbar spine. There is a chronic avulsion fracture involving the left greater femoral trochanter. The visualized bowel gas pattern is grossly unremarkable.  IMPRESSION: 1. Mildly displaced slightly comminuted fracture again noted involving the right greater femoral trochanter, extending to the prosthesis. 2. Chronic avulsion fracture again noted involving the left greater femoral trochanter.   Electronically Signed   By: Roanna Raider M.D.   On: 03/10/2015 02:40   Dg Femur, Min 2 Views Right  03/10/2015   CLINICAL DATA:  Assess periprosthetic fracture of the right femur. Initial encounter.  EXAM: RIGHT FEMUR 2 VIEWS  COMPARISON:  None.  FINDINGS: There is a mildly comminuted mildly displaced fracture involving the right greater femoral trochanter, extending to the prosthesis. No additional fractures are noted along the femur. The femoral stem is grossly intact. Degenerative change is noted at the right knee joint, with lateral compartment narrowing. No significant knee joint effusion is seen. No definite soft tissue abnormalities are characterized on radiograph.  IMPRESSION: 1. Mildly comminuted mildly displaced fracture involving the right greater femoral trochanter, extending to the prosthesis. 2. Osteoarthritis of the right knee, with lateral compartment narrowing.   Electronically Signed   By: Roanna Raider M.D.   On: 03/10/2015 02:42    Microbiology: No results found for this or any previous visit (from the past 240 hour(s)).   Labs: Basic Metabolic Panel:  Recent Labs Lab 03/09/15 2330 03/10/15 0657  NA 138 136  K 4.5 4.4  CL 105 102  CO2 28 26  GLUCOSE 151* 108*  BUN 26* 23*  CREATININE 0.86 0.85  CALCIUM 9.1 9.3   Liver Function Tests:  Recent Labs Lab 03/09/15 2330 03/10/15 0657  AST 22 23  ALT 13* 12*  ALKPHOS 50 52   BILITOT 0.4 0.7  PROT 6.1* 6.0*  ALBUMIN 3.9 3.5   No results for input(s): LIPASE, AMYLASE in the last 168 hours. No results for input(s): AMMONIA in the last 168 hours. CBC:  Recent Labs Lab 03/09/15 2330 03/10/15 0657 03/11/15 0918  WBC 9.9 8.3 9.0  NEUTROABS 7.5 6.1  --   HGB 12.6 12.0 11.6*  HCT 38.6 36.7 35.7*  MCV 96.3 96.3 95.5  PLT 209 193 179   Cardiac Enzymes: No results for input(s): CKTOTAL, CKMB, CKMBINDEX, TROPONINI in the last 168 hours. BNP: BNP (last 3 results) No results for input(s): BNP in the last 8760 hours.  ProBNP (last 3 results) No results for input(s): PROBNP in the last 8760  hours.  CBG:  Recent Labs Lab 03/10/15 0904 03/10/15 1302 03/10/15 1727 03/10/15 2052 03/11/15 0859  GLUCAP 89 131* 168* 155* 242*       Signed:  Solenne Manwarren  [facility] 03/11/2015, 11:43 AM

## 2015-03-11 NOTE — Clinical Social Work Placement (Signed)
   CLINICAL SOCIAL WORK PLACEMENT  NOTE  Date:  03/11/2015  Patient Details  Name: Dawn Kaufman MRN: [id number] Date of Birth: 26-Jun-1927  Clinical Social Work is seeking post-discharge placement for this patient at the Skilled  Nursing Facility level of care (*CSW will initial, date and re-position this form in  chart as items are completed):  Yes   Patient/family provided with [facility] Clinical Social Work Department's list of facilities offering this level of care within the geographic area requested by the patient (or if unable, by the patient's family).  Yes   Patient/family informed of their freedom to choose among providers that offer the needed level of care, that participate in Medicare, Medicaid or managed care program needed by the patient, have an available bed and are willing to accept the patient.  Yes   Patient/family informed of [facility]'s ownership interest in [facility] and [facility], as well as of the fact that they are under no obligation to receive care at these facilities.  PASRR submitted to EDS on       PASRR number received on       Existing PASRR number confirmed on 03/10/15     FL2 transmitted to all facilities in geographic area requested by pt/family on 03/10/15     FL2 transmitted to all facilities within larger geographic area on       Patient informed that his/her managed care company has contracts with or will negotiate with certain facilities, including the following:        Yes   Patient/family informed of bed offers received.  Patient chooses bed at [facility]     Physician recommends and patient chooses bed at      Patient to be transferred to [facility] on 03/11/15.  Patient to be transferred to facility by       Patient family notified on 03/11/15 of transfer.  Name of family member notified:  DAUGHTER     PHYSICIAN       Additional Comment: Pt / daughter in agreement with d/c to [facility] today. [facility]  transport required. Pt/ daughter are aware out of pocket costs may be associated with [facility] transport. NSG reviewed d/c summary, avs. D/C summary sent to SNF for review prior to d/c.   _______________________________________________ Royetta Asal, LCSW 03/11/2015, 6:31 PM

## 2015-03-11 NOTE — Progress Notes (Signed)
CSW assisting with d/c planning. Pt / family are requesting ST Rehab at [facility], pvt pay. SNF has a bed today if pt is stable for d/c. CSW will assist with d/c planning to SNF.  Cori Razor LCSW [phone]

## 2015-03-11 NOTE — Progress Notes (Signed)
Occupational Therapy Treatment Patient Details Name: Dawn Kaufman MRN: [id number] DOB: March 16, 1927 Today's Date: 03/11/2015    History of present illness RIGHT [place] AG periprosthetic femur fx with stable implant nonoperative treatment   OT comments  Making good progress with SPT; needed total A for hygiene and clothing management.  Needs reinforcement for THPs  Follow Up Recommendations  SNF;Supervision/Assistance - 24 hour (vs)    Equipment Recommendations  3 in 1 bedside comode    Recommendations for Other Services      Precautions / Restrictions Precautions Precautions: Posterior Hip and (NO ACTIVE ABDUCTION) Restrictions RLE Weight Bearing: Weight bearing as tolerated LLE Weight Bearing: Weight bearing as tolerated       Mobility Bed Mobility   Bed Mobility: Supine to Sit     Supine to sit: Mod assist     General bed mobility comments: assist for bil LEs  Transfers     Transfers: Sit to/from [employer] Transfers Sit to Stand: Min assist;From elevated surface Stand pivot transfers: Min assist       General transfer comment: multimodal cues for sequence and THPs    Balance                                   ADL       Grooming: Oral care;Set up;Sitting                   Toilet Transfer: Minimal assistance;Stand-pivot;BSC   Toileting- Clothing Manipulation and Hygiene: Total assistance;Sit to/from stand         General ADL Comments: Pt fatiqued after using BSC, performed oral care from sitting EOB.  Cues for UE placement and THPs during transfer      Vision                     Perception     Praxis      Cognition   Behavior During Therapy: [facility] for tasks assessed/performed Overall Cognitive Status: History of cognitive impairments - at baseline                       Extremity/Trunk Assessment               Exercises     Shoulder Instructions       General Comments       Pertinent Vitals/ Pain       Pain Assessment: 0-10 Pain Score: 4  Pain Location: R ribs Pain Descriptors / Indicators: Sore Pain Intervention(s): Limited activity within patient's tolerance;Monitored during session;Premedicated before session;Repositioned  Home Living                                          Prior Functioning/Environment              Frequency Min 2X/week     Progress Toward Goals  OT Goals(current goals can now be found in the care plan section)  Progress towards OT goals: Progressing toward goals     Plan      Co-evaluation                 End of Session     Activity Tolerance No increased pain   Patient Left in bed;with call bell/phone within reach;with bed alarm set  Nurse Communication          Time: 1610-9604 OT Time Calculation (min): 24 min  Charges: OT General Charges $OT Visit: 1 Procedure OT Treatments $Self Care/Home Management : 23-37 mins  Ladaysha Soutar 03/11/2015, 9:02 AM   Marica Otter, OTR/L [phone] 03/11/2015

## 2015-03-11 NOTE — Telephone Encounter (Signed)
Rx faxed to [facility] @ [phone], phone number [phone]  

## 2015-03-11 NOTE — Progress Notes (Signed)
Physical Therapy Treatment Patient Details Name: Dawn Kaufman MRN: [id number] DOB: January 14, 1927 Today's Date: 03/11/2015    History of Present Illness RIGHT [place] periprosthetic femur fx with stable implant nonoperative treatment    PT Comments    Assisted pt OOB to amb a limited distance with + 2 assist.   Follow Up Recommendations  SNF     Equipment Recommendations       Recommendations for Other Services       Precautions / Restrictions Precautions Precautions: Posterior Hip Precaution Comments: no active hip abduction on R Restrictions Weight Bearing Restrictions: No RLE Weight Bearing: Weight bearing as tolerated LLE Weight Bearing: Weight bearing as tolerated    Mobility  Bed Mobility Overal bed mobility: Needs Assistance;+2 for physical assistance Bed Mobility: Supine to Sit     Supine to sit: Mod assist     General bed mobility comments: assist for bil LEs  Transfers Overall transfer level: Needs assistance Equipment used: Rolling walker (2 wheeled);Right platform walker Transfers: Sit to/from Stand Sit to Stand: Min assist;From elevated surface Stand pivot transfers: Min assist       General transfer comment: multimodal cues for sequence and THPs  Ambulation/Gait Ambulation/Gait assistance: +2 safety/equipment;Min assist;Mod assist Ambulation Distance (Feet): 35 Feet Assistive device: Rolling walker (2 wheeled);Right platform walker Gait Pattern/deviations: Step-to pattern;Decreased stance time - right;Trunk flexed Gait velocity: decreased   General Gait Details: very unsteady gait requiring + 2 assist for safety.     Stairs            Wheelchair Mobility    Modified Rankin (Stroke Patients Only)       Balance                                    Cognition Arousal/Alertness: Awake/alert Behavior During Therapy: WFL for tasks assessed/performed                        Exercises      General  Comments        Pertinent Vitals/Pain Pain Assessment: 0-10 Pain Score: 6  Pain Location: R ribs Pain Descriptors / Indicators: Constant;Sore Pain Intervention(s): Premedicated before session;Repositioned;Monitored during session    Home Living                      Prior Function            PT Goals (current goals can now be found in the care plan section) Progress towards PT goals: Progressing toward goals    Frequency  Min 3X/week    PT Plan      Co-evaluation             End of Session Equipment Utilized During Treatment: Gait belt Activity Tolerance: Patient limited by fatigue;Patient limited by pain Patient left: in chair;with call bell/phone within reach;with family/visitor present     Time: 4098-1191 PT Time Calculation (min) (ACUTE ONLY): 25 min  Charges:  $Gait Training: 8-22 mins $Therapeutic Activity: 8-22 mins                    G Codes:      Felecia Shelling  PTA [facility]  Acute  Rehab Pager      [phone]

## 2015-03-11 NOTE — Care Management Note (Signed)
Case Management Note  Patient Details  Name: Dawn Kaufman MRN: [id number] Date of Birth: 1926-12-15  Subjective/Objective: d/c to SNF.                   Action/Plan:d/c SNF-[facility].   Expected Discharge Date               Expected Discharge Plan:  Skilled Nursing Facility  In-House Referral:     Discharge planning Services  CM Consult  Post Acute Care Choice:    Choice offered to:  Patient, Adult Children  DME Arranged:    DME Agency:     HH Arranged:    HH Agency:     Status of Service:  Completed, signed off  Medicare Important Message Given:    Date Medicare IM Given:    Medicare IM give by:    Date Additional Medicare IM Given:    Additional Medicare Important Message give by:     If discussed at Long Length of Stay Meetings, dates discussed:    Additional Comments:  Lanier Clam, RN 03/11/2015, 10:04 AM

## 2015-03-15 ENCOUNTER — Encounter: Payer: Self-pay | Admitting: Adult Health

## 2015-03-15 ENCOUNTER — Non-Acute Institutional Stay (SKILLED_NURSING_FACILITY): Payer: Medicare Other | Admitting: Adult Health

## 2015-03-15 DIAGNOSIS — E118 Type 2 diabetes mellitus with unspecified complications: Secondary | ICD-10-CM | POA: Diagnosis not present

## 2015-03-15 DIAGNOSIS — F329 Major depressive disorder, single episode, unspecified: Secondary | ICD-10-CM

## 2015-03-15 DIAGNOSIS — S72101S Unspecified trochanteric fracture of right femur, sequela: Secondary | ICD-10-CM

## 2015-03-15 DIAGNOSIS — I10 Essential (primary) hypertension: Secondary | ICD-10-CM | POA: Diagnosis not present

## 2015-03-15 DIAGNOSIS — K5900 Constipation, unspecified: Secondary | ICD-10-CM | POA: Diagnosis not present

## 2015-03-15 DIAGNOSIS — S2241XS Multiple fractures of ribs, right side, sequela: Secondary | ICD-10-CM | POA: Diagnosis not present

## 2015-03-15 DIAGNOSIS — F32A Depression, unspecified: Secondary | ICD-10-CM

## 2015-03-15 DIAGNOSIS — K219 Gastro-esophageal reflux disease without esophagitis: Secondary | ICD-10-CM | POA: Diagnosis not present

## 2015-03-15 DIAGNOSIS — F419 Anxiety disorder, unspecified: Secondary | ICD-10-CM

## 2015-03-15 LAB — TYPE AND SCREEN
ABO/RH(D): A POS
ANTIBODY SCREEN: NEGATIVE
UNIT DIVISION: 0
UNIT DIVISION: 0

## 2015-03-16 ENCOUNTER — Non-Acute Institutional Stay (SKILLED_NURSING_FACILITY): Payer: Medicare Other | Admitting: Internal Medicine

## 2015-03-16 DIAGNOSIS — E43 Unspecified severe protein-calorie malnutrition: Secondary | ICD-10-CM | POA: Diagnosis not present

## 2015-03-16 DIAGNOSIS — E118 Type 2 diabetes mellitus with unspecified complications: Secondary | ICD-10-CM

## 2015-03-16 DIAGNOSIS — F39 Unspecified mood [affective] disorder: Secondary | ICD-10-CM | POA: Diagnosis not present

## 2015-03-16 DIAGNOSIS — S2241XD Multiple fractures of ribs, right side, subsequent encounter for fracture with routine healing: Secondary | ICD-10-CM

## 2015-03-16 DIAGNOSIS — K5901 Slow transit constipation: Secondary | ICD-10-CM | POA: Diagnosis not present

## 2015-03-16 DIAGNOSIS — D62 Acute posthemorrhagic anemia: Secondary | ICD-10-CM

## 2015-03-16 DIAGNOSIS — S61212D Laceration without foreign body of right middle finger without damage to nail, subsequent encounter: Secondary | ICD-10-CM | POA: Diagnosis not present

## 2015-03-16 DIAGNOSIS — R2681 Unsteadiness on feet: Secondary | ICD-10-CM

## 2015-03-16 DIAGNOSIS — S72101D Unspecified trochanteric fracture of right femur, subsequent encounter for closed fracture with routine healing: Secondary | ICD-10-CM | POA: Diagnosis not present

## 2015-03-16 DIAGNOSIS — K219 Gastro-esophageal reflux disease without esophagitis: Secondary | ICD-10-CM

## 2015-03-16 NOTE — Progress Notes (Signed)
Patient ID: Dawn Kaufman, female   DOB: 21-Sep-1926, 81 y.o.   MRN: [id number]     [facility]   PCP: [facility], [place]  Code Status: full code   Allergies  Allergen Reactions  . Penicillins Swelling  . Latex Hives  . Shellfish Allergy     Develops gout with frequent consumption of shellfish    Chief Complaint  Patient presents with  . New Admit To SNF     HPI:  81 y.o. patient is here for short term rehabilitation post hospital admission from 03/09/15-03/11/15 post mechanical fall with closed fracture of trochanter of right femur. She underwent conservative treatment with pain management and WBAT. She is seen in her room today with her daughter present. Her pain is not under control and she does not remember to ask for pain medication. She is alert and oriented to person, place and time. No other concerns.   Review of Systems:  Constitutional: Negative for fever, chills, diaphoresis.  HENT: Negative for headache, congestion, nasal discharge Eyes: Negative for eye pain, blurred vision, double vision and discharge.  Respiratory: Negative for cough, shortness of breath and wheezing.   Cardiovascular: Negative for chest pain, palpitations, leg swelling.  Gastrointestinal: Negative for heartburn, nausea, vomiting, abdominal pain. Bowel movement yesterday Genitourinary: Negative for dysuria Musculoskeletal: Negative for back pain, falls Skin: Negative for itching, rash.  Neurological: Negative for dizziness, tingling, focal weakness Psychiatric/Behavioral: Negative for depression   Past Medical History  Diagnosis Date  . Hypertension   . Depression    Past Surgical History  Procedure Laterality Date  . Cataract extraction both eyes    . Fracture surgery     Social History:   reports that she has been smoking.  She does not have any smokeless tobacco history on file. She reports that she does not drink alcohol or use illicit drugs.  No  family history on file.  Medications:   Medication List       This list is accurate as of: 03/16/15  4:14 PM.  Always use your most recent med list.               C 500 500 MG tablet  Generic drug:  ascorbic acid  Take 500 mg by mouth daily.     calcium-vitamin D 250-125 MG-UNIT per tablet  Commonly known as:  OSCAL  Take 1 tablet by mouth 2 (two) times daily.     feeding supplement Liqd  Take 1 Container by mouth 2 (two) times daily between meals.     HYDROcodone-acetaminophen 5-325 MG per tablet  Commonly known as:  LORTAB  Take 1 tablet by mouth every 6 (six) hours as needed for moderate pain.     lisinopril 5 MG tablet  Commonly known as:  PRINIVIL,ZESTRIL  Take 5 mg by mouth daily.     LORazepam 0.5 MG tablet  Commonly known as:  ATIVAN  Take 0.5 mg by mouth 2 (two) times daily as needed for anxiety or sleep.     metFORMIN 500 MG tablet  Commonly known as:  GLUCOPHAGE  Take by mouth 2 (two) times daily with a meal.     MINICAPS VITAMIN-D/OMEGA-3 [phone] MG-UNIT Caps  Take 1 tablet by mouth daily.     multivitamin with minerals tablet  Take 1 tablet by mouth daily.     omeprazole 20 MG capsule  Commonly known as:  PRILOSEC  Take 20 mg by mouth daily.  senna-docusate 8.6-50 MG per tablet  Commonly known as:  Senokot-S  Take 1 tablet by mouth at bedtime as needed for mild constipation.     triamterene-hydrochlorothiazide 37.5-25 MG per capsule  Commonly known as:  DYAZIDE  Take 0.25 capsules by mouth daily as needed (fluid).     venlafaxine XR 75 MG 24 hr capsule  Commonly known as:  EFFEXOR-XR  Take 75 mg by mouth 2 (two) times daily.         Physical Exam: Filed Vitals:   03/16/15 1612  BP: 114/73  Pulse: 74  Temp: 99.1 F (37.3 C)  Resp: 16  SpO2: 95%    General- elderly female, thin built, in no acute distress Head- normocephalic, atraumatic, resolving bruise to right side of face Nose- normal nasal mucosa, no maxillary or frontal  sinus tenderness, no nasal discharge Throat- moist mucus membrane  Eyes- PERRLA, EOMI, no pallor, no icterus, no discharge, normal conjunctiva, normal sclera Neck- no cervical lymphadenopathy Cardiovascular- normal s1,s2, palpable dorsalis pedis and radial pulses, trace right leg edema Respiratory- bilateral clear to auscultation, no wheeze, no rhonchi, no crackles, no use of accessory muscles Abdomen- bowel sounds present, soft, non tender Musculoskeletal- able to move all 4 extremities, right leg ROM limited at hip region, on wheelchair  Neurological- no focal deficit, alert and oriented to person, place and time Skin- warm and dry, right middle finger with dressing Psychiatry- normal mood and affect    Labs reviewed: Basic Metabolic Panel:  Recent Labs  11/91/47 2330 03/10/15 0657  NA 138 136  K 4.5 4.4  CL 105 102  CO2 28 26  GLUCOSE 151* 108*  BUN 26* 23*  CREATININE 0.86 0.85  CALCIUM 9.1 9.3   Liver Function Tests:  Recent Labs  03/09/15 2330 03/10/15 0657  AST 22 23  ALT 13* 12*  ALKPHOS 50 52  BILITOT 0.4 0.7  PROT 6.1* 6.0*  ALBUMIN 3.9 3.5   No results for input(s): LIPASE, AMYLASE in the last 8760 hours. No results for input(s): AMMONIA in the last 8760 hours. CBC:  Recent Labs  03/09/15 2330 03/10/15 0657 03/11/15 0918  WBC 9.9 8.3 9.0  NEUTROABS 7.5 6.1  --   HGB 12.6 12.0 11.6*  HCT 38.6 36.7 35.7*  MCV 96.3 96.3 95.5  PLT 209 193 179   CBG:  Recent Labs  03/10/15 2052 03/11/15 0859 03/11/15 1230  GLUCAP 155* 242* 125*    Radiological Exams: Ct Head Wo Contrast  03/10/2015   CLINICAL DATA:  Status post fall. Hit concrete with right side of head. Initial encounter.  EXAM: CT HEAD WITHOUT CONTRAST  TECHNIQUE: Contiguous axial images were obtained from the base of the skull through the vertex without intravenous contrast.  COMPARISON:  Report from CT of the head performed 05/03/2011  FINDINGS: There is no evidence of acute infarction,  mass lesion, or intra- or extra-axial hemorrhage on CT.  Dystrophic calcification is again noted adjacent to the frontal horn of the left lateral ventricle. Prominence of the ventricles and sulci reflects moderate cortical volume loss. Mild cerebellar atrophy is noted. Scattered periventricular and subcortical white matter change likely reflects small vessel ischemic microangiopathy.  The brainstem and fourth ventricle are within normal limits. The basal ganglia are unremarkable in appearance. The cerebral hemispheres demonstrate grossly normal gray-white differentiation. No mass effect or midline shift is seen.  There is no evidence of fracture; visualized osseous structures are unremarkable in appearance. The orbits are within normal limits. The paranasal sinuses  and mastoid air cells are well-aerated. No significant soft tissue abnormalities are seen.  IMPRESSION: 1. No evidence of traumatic intracranial injury or fracture. 2. Moderate cortical volume loss and scattered small vessel ischemic microangiopathy. 3. Dystrophic calcification again noted adjacent to the frontal horn of the left lateral ventricle, grossly stable per report from prior CT.   Electronically Signed   By: Roanna Raider M.D.   On: 03/10/2015 00:30   Ct Femur Right Wo Contrast  03/10/2015   CLINICAL DATA:  Right anterior thigh pain after falling yesterday with inability to bear weight. Previous right hip arthroplasty. Initial encounter.  EXAM: CT OF THE RIGHT FEMUR WITHOUT CONTRAST  TECHNIQUE: Multidetector CT imaging was performed according to the standard protocol. Multiplanar CT image reconstructions were also generated.  COMPARISON:  Radiographs 03/09/2015 and 03/15/2009.  FINDINGS: The bones are demineralized. Patient is status post right hip hemiarthroplasty. As demonstrated radiographically, there is a comminuted and mildly displaced fracture through the greater trochanter. There is medial extension of a nondisplaced fracture through  the intertrochanteric region of the proximal femur. No diaphyseal fracture extension or loosening of the femoral component is demonstrated. The distal femur is intact.  The hip is located. There is no evidence of acetabular fracture. There are diffuse degenerative changes at the knee meniscal chondrocalcinosis.  There is no large soft tissue hematoma. Scattered vascular calcifications are noted. Foley catheter is in place.  IMPRESSION: 1. Periprosthetic fracture of the proximal right femur with mildly displaced components involving the greater trochanter. 2. No diaphyseal extension of the fracture or loosening of the distal femoral component identified. No dislocation or pelvic fracture.   Electronically Signed   By: Carey Bullocks M.D.   On: 03/10/2015 08:35   Dg Hip Unilat With Pelvis 1v Right  03/10/2015   CLINICAL DATA:  Status post fall, with right hip pain. Initial encounter.  EXAM: DG HIP (WITH OR WITHOUT PELVIS) 1V RIGHT  COMPARISON:  Right hip radiographs performed earlier today at 1:48 p.m.  FINDINGS: As noted on recent right hip radiographs, there is a mildly displaced slightly comminuted fracture involving the right greater femoral trochanter, extending to the prosthesis. The prosthesis is grossly unremarkable in appearance.  Mild degenerative change is noted at the lower lumbar spine. There is a chronic avulsion fracture involving the left greater femoral trochanter. The visualized bowel gas pattern is grossly unremarkable.  IMPRESSION: 1. Mildly displaced slightly comminuted fracture again noted involving the right greater femoral trochanter, extending to the prosthesis. 2. Chronic avulsion fracture again noted involving the left greater femoral trochanter.   Electronically Signed   By: Roanna Raider M.D.   On: 03/10/2015 02:40   Dg Femur, Min 2 Views Right  03/10/2015   CLINICAL DATA:  Assess periprosthetic fracture of the right femur. Initial encounter.  EXAM: RIGHT FEMUR 2 VIEWS  COMPARISON:   None.  FINDINGS: There is a mildly comminuted mildly displaced fracture involving the right greater femoral trochanter, extending to the prosthesis. No additional fractures are noted along the femur. The femoral stem is grossly intact. Degenerative change is noted at the right knee joint, with lateral compartment narrowing. No significant knee joint effusion is seen. No definite soft tissue abnormalities are characterized on radiograph.  IMPRESSION: 1. Mildly comminuted mildly displaced fracture involving the right greater femoral trochanter, extending to the prosthesis. 2. Osteoarthritis of the right knee, with lateral compartment narrowing.   Electronically Signed   By: Roanna Raider M.D.   On: 03/10/2015 02:42  Assessment/Plan  Unsteady gait Post fall and right femur fracture. Will have patient work with PT/OT as tolerated to regain strength and restore function.  Fall precautions are in place.  Closed fracture of trochanter of right femur With conservative management, WBAT to RLE with walker. Has f/u with orthopedics. Change her pain regimen to lortab 5-325 mg bid with 1 tab q6h prn for breakthrough pain and reassess. Will have her work with physical therapy and occupational therapy team to help with gait training and muscle strengthening exercises.fall precautions. Skin care. Encourage to be out of bed. Continue oscsal and hip precautions. Get physiatry consult for assessment on pain management   Closed fracture of seven ribs of right side Advised to take lortaab prn, monitor, breathing stable  Blood loss anemia Monitor h&h  Laceration of right middle finger  Dressing in place, monitor clinically  Protein calorie malnutrition Continue protein supplement, encourage po intake, monitor weight  Diabetes mellitus, type 2 Monitor cbg, continue metformin 500 mg bid  Constipation Continue senna s but with her pain medication being scheduled, change senna s to qhs from prn and  monitor  HTN Monitor bp, continue lisinipril 5 mg daily and dyazide 37.5-25 mg daily, monitor bmp  gerd Stable, continue prilosec 20 mg daily  Depression Stable mood, continue effexor xr 75 mg bid with prn xanax at bedtime and monitor      Goals of care: short term rehabilitation   Labs/tests ordered:  Family/ staff Communication: reviewed care plan with patient and nursing supervisor    Oneal Grout, MD  [facility] [phone] (Monday-Friday 8 am - 5 pm) [phone] (afterhours)

## 2015-03-24 ENCOUNTER — Encounter: Payer: Self-pay | Admitting: Adult Health

## 2015-03-24 ENCOUNTER — Non-Acute Institutional Stay (SKILLED_NURSING_FACILITY): Payer: Medicare Other | Admitting: Adult Health

## 2015-03-24 DIAGNOSIS — K219 Gastro-esophageal reflux disease without esophagitis: Secondary | ICD-10-CM

## 2015-03-24 DIAGNOSIS — F329 Major depressive disorder, single episode, unspecified: Secondary | ICD-10-CM

## 2015-03-24 DIAGNOSIS — F419 Anxiety disorder, unspecified: Secondary | ICD-10-CM

## 2015-03-24 DIAGNOSIS — F32A Depression, unspecified: Secondary | ICD-10-CM

## 2015-03-24 DIAGNOSIS — S72101S Unspecified trochanteric fracture of right femur, sequela: Secondary | ICD-10-CM

## 2015-03-24 DIAGNOSIS — S2241XS Multiple fractures of ribs, right side, sequela: Secondary | ICD-10-CM

## 2015-03-24 DIAGNOSIS — I10 Essential (primary) hypertension: Secondary | ICD-10-CM | POA: Diagnosis not present

## 2015-03-24 DIAGNOSIS — E118 Type 2 diabetes mellitus with unspecified complications: Secondary | ICD-10-CM

## 2015-03-24 DIAGNOSIS — K5900 Constipation, unspecified: Secondary | ICD-10-CM

## 2015-03-24 NOTE — Progress Notes (Signed)
Patient ID: Dawn Kaufman, female   DOB: 1926-10-31, 81 y.o.   MRN: [id number]    DATE:  03/24/15 MRN:  [id number]  BIRTHDAY: 30-Apr-1927  Facility:  Nursing Home Location:  [facility]  Nursing Home Room Number: 602-P  LEVEL OF CARE:  SNF (31)  Contact Information    Name Relation Home Work Mobile   [place] Daughter [phone]  [phone]   Tawni Pummel   [phone]      Chief Complaint  Patient presents with  . Discharge Note    Closed fracture of right femur, closed fracture of 7 ribs on the right side, hypertension, diabetes mellitus, constipation, anxiety, GERD and depression    HISTORY OF PRESENT ILLNESS:  This is an 81 year old female who is for discharge home with Home health PT, OT and Home health aide. DME:  Standard wheelchair with leg rests, anti-tippers and cushion. She has been admitted to [facility] on 03/11/15 from [facility]. She has PMH of hypertension, depression, lives in an independent living facility, DM and right hip arthroplasty. She had a fall and fell on her right side. She was taken to [facility] and then she was transferred to [facility] as she was found to be having a greater trochanteric fracture associated with periprosthetic hardware. Orthopedic consulted and recommended none operative management.  Patient was admitted to this facility for short-term rehabilitation after the patient's recent hospitalization.  Patient has completed SNF rehabilitation and therapy has cleared the patient for discharge.  PAST MEDICAL HISTORY:  Past Medical History  Diagnosis Date  . Hypertension   . Depression      CURRENT MEDICATIONS: Reviewed  Patient's Medications  New Prescriptions   No medications on file  Previous Medications   ASCORBIC ACID (C 500) 500 MG TABLET    Take 500 mg by mouth daily.     CALCIUM-VITAMIN D (OSCAL) 250-125 MG-UNIT PER TABLET    Take 1 tablet by mouth 2 (two) times  daily.     FEEDING SUPPLEMENT (BOOST / RESOURCE BREEZE) LIQD    Take 1 Container by mouth 2 (two) times daily between meals.   HYDROCODONE-ACETAMINOPHEN (LORTAB) 5-325 MG PER TABLET    Take 1 tablet by mouth every 6 (six) hours as needed for moderate pain.   LISINOPRIL (PRINIVIL,ZESTRIL) 5 MG TABLET    Take 5 mg by mouth daily.     LORAZEPAM (ATIVAN) 0.5 MG TABLET    Take 0.5 mg by mouth 2 (two) times daily as needed for anxiety or sleep.    METFORMIN (GLUCOPHAGE) 500 MG TABLET    Take by mouth 2 (two) times daily with a meal.   MULTIPLE VITAMINS-MINERALS (MULTIVITAMIN WITH MINERALS) TABLET    Take 1 tablet by mouth daily.     OMEGA-3 FAT AC-CHOLECALCIFEROL (MINICAPS VITAMIN-D/OMEGA-3) [phone] MG-UNIT CAPS    Take 1 tablet by mouth daily.     OMEPRAZOLE (PRILOSEC) 20 MG CAPSULE    Take 20 mg by mouth daily.   SENNA-DOCUSATE (SENOKOT-S) 8.6-50 MG PER TABLET    Take 1 tablet by mouth at bedtime as needed for mild constipation.   TRIAMTERENE-HYDROCHLOROTHIAZIDE (DYAZIDE) 37.5-25 MG PER CAPSULE    Take 0.25 capsules by mouth daily as needed (fluid).    VENLAFAXINE (EFFEXOR-XR) 75 MG 24 HR CAPSULE    Take 75 mg by mouth 2 (two) times daily.    Modified Medications   No medications on file  Discontinued Medications   No medications on file  Allergies  Allergen Reactions  . Penicillins Swelling  . Latex Hives  . Shellfish Allergy     Develops gout with frequent consumption of shellfish     REVIEW OF SYSTEMS:  GENERAL: no change in appetite, no fatigue, no weight changes, no fever, chills or weakness SKIN:  Right 4th finger sutures were removed EYES: Denies change in vision, dry eyes, eye pain, itching or discharge EARS: Denies change in hearing, ringing in ears, or earache NOSE: Denies nasal congestion or epistaxis MOUTH and THROAT: Denies oral discomfort, gingival pain or bleeding, pain from teeth or hoarseness   RESPIRATORY: no cough, SOB, DOE, wheezing, hemoptysis CARDIAC: no  chest pain, edema or palpitations GI: no abdominal pain, diarrhea, constipation, heart burn, nausea or vomiting GU: Denies dysuria, frequency, hematuria, incontinence, or discharge PSYCHIATRIC: Denies feeling of depression or anxiety. No report of hallucinations, insomnia, paranoia, or agitation  PHYSICAL EXAMINATION  GENERAL APPEARANCE: Well nourished. In no acute distress. Normal body habitus SKIN:  Scab on right 4th finger HEAD: Normal in size and contour. No evidence of trauma EYES: Lids open and close normally. No blepharitis, entropion or ectropion. PERRL. Conjunctivae are clear and sclerae are white. Lenses are without opacity EARS: Pinnae are normal. Patient hears normal voice tunes of the examiner MOUTH and THROAT: Lips are without lesions. Oral mucosa is moist and without lesions. Tongue is normal in shape, size, and color and without lesions NECK: supple, trachea midline, no neck masses, no thyroid tenderness, no thyromegaly LYMPHATICS: no LAN in the neck, no supraclavicular LAN RESPIRATORY: breathing is even & unlabored, BS CTAB CARDIAC: RRR, no murmur,no extra heart sounds, no edema GI: abdomen soft, normal BS, no masses, no tenderness, no hepatomegaly, no splenomegaly EXTREMITIES:  Able to move 4 extremities PSYCHIATRIC: Alert and oriented X 3. Affect and behavior are appropriate  LABS/RADIOLOGY: Labs reviewed: 03/15/15  WBC 6.7 hemoglobin 11.6 hematocrit 37.0 MCV 97.4 sodium 141 potassium 4.2 glucose 117 BUN 22 creatinine 0.81 calcium 9.1 Basic Metabolic Panel:  Recent Labs  16/10/96 2330 03/10/15 0657  NA 138 136  K 4.5 4.4  CL 105 102  CO2 28 26  GLUCOSE 151* 108*  BUN 26* 23*  CREATININE 0.86 0.85  CALCIUM 9.1 9.3   Liver Function Tests:  Recent Labs  03/09/15 2330 03/10/15 0657  AST 22 23  ALT 13* 12*  ALKPHOS 50 52  BILITOT 0.4 0.7  PROT 6.1* 6.0*  ALBUMIN 3.9 3.5   CBC:  Recent Labs  03/09/15 2330 03/10/15 0657 03/11/15 0918  WBC 9.9 8.3  9.0  NEUTROABS 7.5 6.1  --   HGB 12.6 12.0 11.6*  HCT 38.6 36.7 35.7*  MCV 96.3 96.3 95.5  PLT 209 193 179   CBG:  Recent Labs  03/10/15 2052 03/11/15 0859 03/11/15 1230  GLUCAP 155* 242* 125*    Ct Head Wo Contrast  03/10/2015   CLINICAL DATA:  Status post fall. Hit concrete with right side of head. Initial encounter.  EXAM: CT HEAD WITHOUT CONTRAST  TECHNIQUE: Contiguous axial images were obtained from the base of the skull through the vertex without intravenous contrast.  COMPARISON:  Report from CT of the head performed 05/03/2011  FINDINGS: There is no evidence of acute infarction, mass lesion, or intra- or extra-axial hemorrhage on CT.  Dystrophic calcification is again noted adjacent to the frontal horn of the left lateral ventricle. Prominence of the ventricles and sulci reflects moderate cortical volume loss. Mild cerebellar atrophy is noted. Scattered periventricular and subcortical white  matter change likely reflects small vessel ischemic microangiopathy.  The brainstem and fourth ventricle are within normal limits. The basal ganglia are unremarkable in appearance. The cerebral hemispheres demonstrate grossly normal gray-white differentiation. No mass effect or midline shift is seen.  There is no evidence of fracture; visualized osseous structures are unremarkable in appearance. The orbits are within normal limits. The paranasal sinuses and mastoid air cells are well-aerated. No significant soft tissue abnormalities are seen.  IMPRESSION: 1. No evidence of traumatic intracranial injury or fracture. 2. Moderate cortical volume loss and scattered small vessel ischemic microangiopathy. 3. Dystrophic calcification again noted adjacent to the frontal horn of the left lateral ventricle, grossly stable per report from prior CT.   Electronically Signed   By: Roanna Raider M.D.   On: 03/10/2015 00:30   Ct Femur Right Wo Contrast  03/10/2015   CLINICAL DATA:  Right anterior thigh pain after  falling yesterday with inability to bear weight. Previous right hip arthroplasty. Initial encounter.  EXAM: CT OF THE RIGHT FEMUR WITHOUT CONTRAST  TECHNIQUE: Multidetector CT imaging was performed according to the standard protocol. Multiplanar CT image reconstructions were also generated.  COMPARISON:  Radiographs 03/09/2015 and 03/15/2009.  FINDINGS: The bones are demineralized. Patient is status post right hip hemiarthroplasty. As demonstrated radiographically, there is a comminuted and mildly displaced fracture through the greater trochanter. There is medial extension of a nondisplaced fracture through the intertrochanteric region of the proximal femur. No diaphyseal fracture extension or loosening of the femoral component is demonstrated. The distal femur is intact.  The hip is located. There is no evidence of acetabular fracture. There are diffuse degenerative changes at the knee meniscal chondrocalcinosis.  There is no large soft tissue hematoma. Scattered vascular calcifications are noted. Foley catheter is in place.  IMPRESSION: 1. Periprosthetic fracture of the proximal right femur with mildly displaced components involving the greater trochanter. 2. No diaphyseal extension of the fracture or loosening of the distal femoral component identified. No dislocation or pelvic fracture.   Electronically Signed   By: Carey Bullocks M.D.   On: 03/10/2015 08:35   Dg Hip Unilat With Pelvis 1v Right  03/10/2015   CLINICAL DATA:  Status post fall, with right hip pain. Initial encounter.  EXAM: DG HIP (WITH OR WITHOUT PELVIS) 1V RIGHT  COMPARISON:  Right hip radiographs performed earlier today at 1:48 p.m.  FINDINGS: As noted on recent right hip radiographs, there is a mildly displaced slightly comminuted fracture involving the right greater femoral trochanter, extending to the prosthesis. The prosthesis is grossly unremarkable in appearance.  Mild degenerative change is noted at the lower lumbar spine. There is a  chronic avulsion fracture involving the left greater femoral trochanter. The visualized bowel gas pattern is grossly unremarkable.  IMPRESSION: 1. Mildly displaced slightly comminuted fracture again noted involving the right greater femoral trochanter, extending to the prosthesis. 2. Chronic avulsion fracture again noted involving the left greater femoral trochanter.   Electronically Signed   By: Roanna Raider M.D.   On: 03/10/2015 02:40   Dg Femur, Min 2 Views Right  03/10/2015   CLINICAL DATA:  Assess periprosthetic fracture of the right femur. Initial encounter.  EXAM: RIGHT FEMUR 2 VIEWS  COMPARISON:  None.  FINDINGS: There is a mildly comminuted mildly displaced fracture involving the right greater femoral trochanter, extending to the prosthesis. No additional fractures are noted along the femur. The femoral stem is grossly intact. Degenerative change is noted at the right knee joint, with lateral  compartment narrowing. No significant knee joint effusion is seen. No definite soft tissue abnormalities are characterized on radiograph.  IMPRESSION: 1. Mildly comminuted mildly displaced fracture involving the right greater femoral trochanter, extending to the prosthesis. 2. Osteoarthritis of the right knee, with lateral compartment narrowing.   Electronically Signed   By: Roanna Raider M.D.   On: 03/10/2015 02:42    ASSESSMENT/PLAN:  Closed fracture of trochanter/right femur - none operative management; for Home health PT, OT and Home health aide; RLE WBAT; walker 11 weeks; no active hip abduction; continue Norco 5/325 mg 1 tab by mouth every 6 hours when necessary for pain; follow-up with Dr. Linna Caprice, orthopedic surgeon  Hypertension - well controlled; continue lisinopril 5 mg 1 tab by mouth daily and Dyazide 37.5-25 mg 1 capsule by mouth daily PRN  Closed fracture of 7 ribs on right side - continue Norco 5/325 mg 1 tab by mouth every 6 hours when necessary for pain  Diabetes mellitus, type II -  continue metformin 500 mg 1 tab by mouth twice a day  Constipation - continue senna S1 tab by mouth daily at bedtime when necessary  Anxiety - mood is stable; continue Ativan 0.5 mg 1 tab by mouth twice a day when necessary  GERD - continue Prilosec 20 mg 1 capsule by mouth daily  Depression - mood is stable; continue Effexor XR 75 mg 1 capsule by mouth twice a day     I have filled out patient's discharge paperwork and written prescriptions.  Patient will receive home health PT, OT and Home health aide.  DME provided:  Standard wheelchair with leg rests, anti-tippers and cushion  Total discharge time: Greater than 30 minutes  Discharge time involved coordination of the discharge process with social worker, nursing staff and therapy department. Medical justification for home health services/DME verified.   [facility], NP [employer] [phone]

## 2015-03-24 NOTE — Progress Notes (Signed)
Patient ID: Dawn Kaufman, female   DOB: Jul 13, 1926, 81 y.o.   MRN: [id number]    DATE:  03/15/15 MRN:  [id number]  BIRTHDAY: 12-14-1926  Facility:  Nursing Home Location:  [facility]  Nursing Home Room Number: 602-P  LEVEL OF CARE:  SNF (31)  Contact Information    Name Relation Home Work Mobile   [place] Daughter [phone]  [phone]   Tawni Pummel   [phone]      Chief Complaint  Patient presents with  . Hospitalization Follow-up    Closed fracture of right femur, closed fracture of 7 ribs on the right side, hypertension, diabetes mellitus, constipation, anxiety, GERD and depression    HISTORY OF PRESENT ILLNESS:  This is an 81 year old female who was been admitted to [facility] on 03/11/15 from [facility]. She has PMH of hypertension, depression, lives in an independent living facility, DM and right hip arthroplasty. She had a fall and fell on her right side. She was taken to [facility] and then she was transferred to [facility] as she was found to be having a greater trochanteric fracture associated with periprosthetic hardware. Orthopedic consulted and recommended none operative management.  She has been admitted for a short-term rehabilitation.  PAST MEDICAL HISTORY:  Past Medical History  Diagnosis Date  . Hypertension   . Depression      CURRENT MEDICATIONS: Reviewed  Patient's Medications  New Prescriptions   No medications on file  Previous Medications   ASCORBIC ACID (C 500) 500 MG TABLET    Take 500 mg by mouth daily.     CALCIUM-VITAMIN D (OSCAL) 250-125 MG-UNIT PER TABLET    Take 1 tablet by mouth 2 (two) times daily.     FEEDING SUPPLEMENT (BOOST / RESOURCE BREEZE) LIQD    Take 1 Container by mouth 2 (two) times daily between meals.   HYDROCODONE-ACETAMINOPHEN (LORTAB) 5-325 MG PER TABLET    Take 1 tablet by mouth every 6 (six) hours as needed for moderate pain.   LISINOPRIL  (PRINIVIL,ZESTRIL) 5 MG TABLET    Take 5 mg by mouth daily.     LORAZEPAM (ATIVAN) 0.5 MG TABLET    Take 0.5 mg by mouth 2 (two) times daily as needed for anxiety or sleep.    METFORMIN (GLUCOPHAGE) 500 MG TABLET    Take by mouth 2 (two) times daily with a meal.   MULTIPLE VITAMINS-MINERALS (MULTIVITAMIN WITH MINERALS) TABLET    Take 1 tablet by mouth daily.     OMEGA-3 FAT AC-CHOLECALCIFEROL (MINICAPS VITAMIN-D/OMEGA-3) 782 [id number] 5221 MG-UNIT CAPS    Take 1 tablet by mouth daily.     OMEPRAZOLE (PRILOSEC) 20 MG CAPSULE    Take 20 mg by mouth daily.   SENNA-DOCUSATE (SENOKOT-S) 8.6-50 MG PER TABLET    Take 1 tablet by mouth at bedtime as needed for mild constipation.   TRIAMTERENE-HYDROCHLOROTHIAZIDE (DYAZIDE) 37.5-25 MG PER CAPSULE    Take 0.25 capsules by mouth daily as needed (fluid).    VENLAFAXINE (EFFEXOR-XR) 75 MG 24 HR CAPSULE    Take 75 mg by mouth 2 (two) times daily.    Modified Medications   No medications on file  Discontinued Medications   No medications on file     Allergies  Allergen Reactions  . Penicillins Swelling  . Latex Hives  . Shellfish Allergy     Develops gout with frequent consumption of shellfish     REVIEW OF SYSTEMS:  GENERAL: no change in appetite,  no fatigue, no weight changes, no fever, chills or weakness EYES: Denies change in vision, dry eyes, eye pain, itching or discharge EARS: Denies change in hearing, ringing in ears, or earache NOSE: Denies nasal congestion or epistaxis MOUTH and THROAT: Denies oral discomfort, gingival pain or bleeding, pain from teeth or hoarseness   RESPIRATORY: no cough, SOB, DOE, wheezing, hemoptysis CARDIAC: no chest pain, edema or palpitations GI: no abdominal pain, diarrhea, constipation, heart burn, nausea or vomiting GU: Denies dysuria, frequency, hematuria, incontinence, or discharge PSYCHIATRIC: Denies feeling of depression or anxiety. No report of hallucinations, insomnia, paranoia, or agitation  PHYSICAL  EXAMINATION  GENERAL APPEARANCE: Well nourished. In no acute distress. Normal body habitus SKIN:  Right temple with slight bruising; right 4th finger has dressing (sutured) HEAD: Normal in size and contour. No evidence of trauma EYES: Lids open and close normally. No blepharitis, entropion or ectropion. PERRL. Conjunctivae are clear and sclerae are white. Lenses are without opacity EARS: Pinnae are normal. Patient hears normal voice tunes of the examiner MOUTH and THROAT: Lips are without lesions. Oral mucosa is moist and without lesions. Tongue is normal in shape, size, and color and without lesions NECK: supple, trachea midline, no neck masses, no thyroid tenderness, no thyromegaly LYMPHATICS: no LAN in the neck, no supraclavicular LAN RESPIRATORY: breathing is even & unlabored, BS CTAB CARDIAC: RRR, no murmur,no extra heart sounds, no edema GI: abdomen soft, normal BS, no masses, no tenderness, no hepatomegaly, no splenomegaly EXTREMITIES:  Able to move 4 extremities PSYCHIATRIC: Alert and oriented X 3. Affect and behavior are appropriate  LABS/RADIOLOGY: Labs reviewed: Basic Metabolic Panel:  Recent Labs  16/10/96 2330 03/10/15 0657  NA 138 136  K 4.5 4.4  CL 105 102  CO2 28 26  GLUCOSE 151* 108*  BUN 26* 23*  CREATININE 0.86 0.85  CALCIUM 9.1 9.3   Liver Function Tests:  Recent Labs  03/09/15 2330 03/10/15 0657  AST 22 23  ALT 13* 12*  ALKPHOS 50 52  BILITOT 0.4 0.7  PROT 6.1* 6.0*  ALBUMIN 3.9 3.5   CBC:  Recent Labs  03/09/15 2330 03/10/15 0657 03/11/15 0918  WBC 9.9 8.3 9.0  NEUTROABS 7.5 6.1  --   HGB 12.6 12.0 11.6*  HCT 38.6 36.7 35.7*  MCV 96.3 96.3 95.5  PLT 209 193 179   CBG:  Recent Labs  03/10/15 2052 03/11/15 0859 03/11/15 1230  GLUCAP 155* 242* 125*    Ct Head Wo Contrast  03/10/2015   CLINICAL DATA:  Status post fall. Hit concrete with right side of head. Initial encounter.  EXAM: CT HEAD WITHOUT CONTRAST  TECHNIQUE: Contiguous  axial images were obtained from the base of the skull through the vertex without intravenous contrast.  COMPARISON:  Report from CT of the head performed 05/03/2011  FINDINGS: There is no evidence of acute infarction, mass lesion, or intra- or extra-axial hemorrhage on CT.  Dystrophic calcification is again noted adjacent to the frontal horn of the left lateral ventricle. Prominence of the ventricles and sulci reflects moderate cortical volume loss. Mild cerebellar atrophy is noted. Scattered periventricular and subcortical white matter change likely reflects small vessel ischemic microangiopathy.  The brainstem and fourth ventricle are within normal limits. The basal ganglia are unremarkable in appearance. The cerebral hemispheres demonstrate grossly normal gray-white differentiation. No mass effect or midline shift is seen.  There is no evidence of fracture; visualized osseous structures are unremarkable in appearance. The orbits are within normal limits. The paranasal sinuses  and mastoid air cells are well-aerated. No significant soft tissue abnormalities are seen.  IMPRESSION: 1. No evidence of traumatic intracranial injury or fracture. 2. Moderate cortical volume loss and scattered small vessel ischemic microangiopathy. 3. Dystrophic calcification again noted adjacent to the frontal horn of the left lateral ventricle, grossly stable per report from prior CT.   Electronically Signed   By: Roanna Raider M.D.   On: 03/10/2015 00:30   Ct Femur Right Wo Contrast  03/10/2015   CLINICAL DATA:  Right anterior thigh pain after falling yesterday with inability to bear weight. Previous right hip arthroplasty. Initial encounter.  EXAM: CT OF THE RIGHT FEMUR WITHOUT CONTRAST  TECHNIQUE: Multidetector CT imaging was performed according to the standard protocol. Multiplanar CT image reconstructions were also generated.  COMPARISON:  Radiographs 03/09/2015 and 03/15/2009.  FINDINGS: The bones are demineralized. Patient is  status post right hip hemiarthroplasty. As demonstrated radiographically, there is a comminuted and mildly displaced fracture through the greater trochanter. There is medial extension of a nondisplaced fracture through the intertrochanteric region of the proximal femur. No diaphyseal fracture extension or loosening of the femoral component is demonstrated. The distal femur is intact.  The hip is located. There is no evidence of acetabular fracture. There are diffuse degenerative changes at the knee meniscal chondrocalcinosis.  There is no large soft tissue hematoma. Scattered vascular calcifications are noted. Foley catheter is in place.  IMPRESSION: 1. Periprosthetic fracture of the proximal right femur with mildly displaced components involving the greater trochanter. 2. No diaphyseal extension of the fracture or loosening of the distal femoral component identified. No dislocation or pelvic fracture.   Electronically Signed   By: Carey Bullocks M.D.   On: 03/10/2015 08:35   Dg Hip Unilat With Pelvis 1v Right  03/10/2015   CLINICAL DATA:  Status post fall, with right hip pain. Initial encounter.  EXAM: DG HIP (WITH OR WITHOUT PELVIS) 1V RIGHT  COMPARISON:  Right hip radiographs performed earlier today at 1:48 p.m.  FINDINGS: As noted on recent right hip radiographs, there is a mildly displaced slightly comminuted fracture involving the right greater femoral trochanter, extending to the prosthesis. The prosthesis is grossly unremarkable in appearance.  Mild degenerative change is noted at the lower lumbar spine. There is a chronic avulsion fracture involving the left greater femoral trochanter. The visualized bowel gas pattern is grossly unremarkable.  IMPRESSION: 1. Mildly displaced slightly comminuted fracture again noted involving the right greater femoral trochanter, extending to the prosthesis. 2. Chronic avulsion fracture again noted involving the left greater femoral trochanter.   Electronically Signed    By: Roanna Raider M.D.   On: 03/10/2015 02:40   Dg Femur, Min 2 Views Right  03/10/2015   CLINICAL DATA:  Assess periprosthetic fracture of the right femur. Initial encounter.  EXAM: RIGHT FEMUR 2 VIEWS  COMPARISON:  None.  FINDINGS: There is a mildly comminuted mildly displaced fracture involving the right greater femoral trochanter, extending to the prosthesis. No additional fractures are noted along the femur. The femoral stem is grossly intact. Degenerative change is noted at the right knee joint, with lateral compartment narrowing. No significant knee joint effusion is seen. No definite soft tissue abnormalities are characterized on radiograph.  IMPRESSION: 1. Mildly comminuted mildly displaced fracture involving the right greater femoral trochanter, extending to the prosthesis. 2. Osteoarthritis of the right knee, with lateral compartment narrowing.   Electronically Signed   By: Roanna Raider M.D.   On: 03/10/2015 02:42  ASSESSMENT/PLAN:  Closed fracture of trochanter/right femur - none operative management; RLE WBAT; walker 12 weeks; no active hip abduction; continue Norco 5/325 mg 1 tab by mouth every 6 hours when necessary for pain; follow-up with Dr. Linna Caprice, orthopedic surgeon, in 2 weeks; check CBC  Hypertension - well controlled; continue lisinopril 5 mg 1 tab by mouth daily and Dyazide 37.5-25 mg 1 capsule by mouth daily; check BMP  Closed fracture of 7 ribs on right side - continue Norco 5/325 mg 1 tab by mouth every 6 hours when necessary for pain  Diabetes mellitus, type II - continue metformin 500 mg 1 tab by mouth twice a day  Constipation - continue senna S1 tab by mouth daily at bedtime when necessary  Anxiety - mood is stable; continue Ativan 0.5 mg 1 tab by mouth twice a day when necessary  GERD - continue Prilosec 20 mg 1 capsule by mouth daily  Depression - mood is stable; continue Effexor XR 75 mg 1 capsule by mouth twice a day    Goals of care:  Short-term  rehabilitation    [facility], NP [facility] [phone]

## 2015-08-29 ENCOUNTER — Emergency Department (HOSPITAL_COMMUNITY): Payer: Medicare Other

## 2015-08-29 ENCOUNTER — Encounter (HOSPITAL_COMMUNITY): Payer: Self-pay

## 2015-08-29 ENCOUNTER — Inpatient Hospital Stay (HOSPITAL_COMMUNITY)
Admission: EM | Admit: 2015-08-29 | Discharge: 2015-09-03 | DRG: 177 | Disposition: A | Discharge status: Skilled Nursing Facility | Payer: Medicare Other | Attending: Internal Medicine | Admitting: Internal Medicine

## 2015-08-29 DIAGNOSIS — G9341 Metabolic encephalopathy: Secondary | ICD-10-CM | POA: Diagnosis present

## 2015-08-29 DIAGNOSIS — Y92129 Unspecified place in nursing home as the place of occurrence of the external cause: Secondary | ICD-10-CM | POA: Diagnosis not present

## 2015-08-29 DIAGNOSIS — Z9181 History of falling: Secondary | ICD-10-CM

## 2015-08-29 DIAGNOSIS — Z79891 Long term (current) use of opiate analgesic: Secondary | ICD-10-CM | POA: Diagnosis not present

## 2015-08-29 DIAGNOSIS — S0181XA Laceration without foreign body of other part of head, initial encounter: Secondary | ICD-10-CM | POA: Diagnosis not present

## 2015-08-29 DIAGNOSIS — E118 Type 2 diabetes mellitus with unspecified complications: Secondary | ICD-10-CM

## 2015-08-29 DIAGNOSIS — J90 Pleural effusion, not elsewhere classified: Secondary | ICD-10-CM | POA: Diagnosis present

## 2015-08-29 DIAGNOSIS — N390 Urinary tract infection, site not specified: Secondary | ICD-10-CM | POA: Diagnosis present

## 2015-08-29 DIAGNOSIS — E860 Dehydration: Secondary | ICD-10-CM | POA: Diagnosis present

## 2015-08-29 DIAGNOSIS — Z66 Do not resuscitate: Secondary | ICD-10-CM | POA: Diagnosis present

## 2015-08-29 DIAGNOSIS — M810 Age-related osteoporosis without current pathological fracture: Secondary | ICD-10-CM | POA: Diagnosis present

## 2015-08-29 DIAGNOSIS — R4702 Dysphasia: Secondary | ICD-10-CM | POA: Diagnosis present

## 2015-08-29 DIAGNOSIS — S2241XA Multiple fractures of ribs, right side, initial encounter for closed fracture: Secondary | ICD-10-CM | POA: Diagnosis not present

## 2015-08-29 DIAGNOSIS — I10 Essential (primary) hypertension: Secondary | ICD-10-CM | POA: Diagnosis present

## 2015-08-29 DIAGNOSIS — Z880 Allergy status to penicillin: Secondary | ICD-10-CM | POA: Diagnosis not present

## 2015-08-29 DIAGNOSIS — W1839XA Other fall on same level, initial encounter: Secondary | ICD-10-CM | POA: Diagnosis present

## 2015-08-29 DIAGNOSIS — Z7982 Long term (current) use of aspirin: Secondary | ICD-10-CM | POA: Diagnosis not present

## 2015-08-29 DIAGNOSIS — Y95 Nosocomial condition: Secondary | ICD-10-CM | POA: Diagnosis present

## 2015-08-29 DIAGNOSIS — R410 Disorientation, unspecified: Secondary | ICD-10-CM | POA: Diagnosis present

## 2015-08-29 DIAGNOSIS — Z96641 Presence of right artificial hip joint: Secondary | ICD-10-CM | POA: Diagnosis present

## 2015-08-29 DIAGNOSIS — Z22322 Carrier or suspected carrier of Methicillin resistant Staphylococcus aureus: Secondary | ICD-10-CM | POA: Diagnosis not present

## 2015-08-29 DIAGNOSIS — J9811 Atelectasis: Secondary | ICD-10-CM | POA: Diagnosis present

## 2015-08-29 DIAGNOSIS — K5900 Constipation, unspecified: Secondary | ICD-10-CM | POA: Diagnosis not present

## 2015-08-29 DIAGNOSIS — E119 Type 2 diabetes mellitus without complications: Secondary | ICD-10-CM | POA: Diagnosis present

## 2015-08-29 DIAGNOSIS — S2241XD Multiple fractures of ribs, right side, subsequent encounter for fracture with routine healing: Secondary | ICD-10-CM | POA: Diagnosis not present

## 2015-08-29 DIAGNOSIS — Z79899 Other long term (current) drug therapy: Secondary | ICD-10-CM | POA: Diagnosis not present

## 2015-08-29 DIAGNOSIS — S2241XS Multiple fractures of ribs, right side, sequela: Secondary | ICD-10-CM | POA: Diagnosis not present

## 2015-08-29 DIAGNOSIS — W19XXXD Unspecified fall, subsequent encounter: Secondary | ICD-10-CM | POA: Diagnosis not present

## 2015-08-29 DIAGNOSIS — Z91040 Latex allergy status: Secondary | ICD-10-CM | POA: Diagnosis not present

## 2015-08-29 DIAGNOSIS — S0990XA Unspecified injury of head, initial encounter: Secondary | ICD-10-CM

## 2015-08-29 DIAGNOSIS — B9620 Unspecified Escherichia coli [E. coli] as the cause of diseases classified elsewhere: Secondary | ICD-10-CM | POA: Diagnosis present

## 2015-08-29 DIAGNOSIS — G9340 Encephalopathy, unspecified: Secondary | ICD-10-CM | POA: Diagnosis not present

## 2015-08-29 DIAGNOSIS — Z681 Body mass index (BMI) 19 or less, adult: Secondary | ICD-10-CM

## 2015-08-29 DIAGNOSIS — J189 Pneumonia, unspecified organism: Secondary | ICD-10-CM | POA: Diagnosis not present

## 2015-08-29 DIAGNOSIS — F0280 Dementia in other diseases classified elsewhere without behavioral disturbance: Secondary | ICD-10-CM | POA: Diagnosis present

## 2015-08-29 DIAGNOSIS — Z7984 Long term (current) use of oral hypoglycemic drugs: Secondary | ICD-10-CM | POA: Diagnosis not present

## 2015-08-29 DIAGNOSIS — F329 Major depressive disorder, single episode, unspecified: Secondary | ICD-10-CM | POA: Diagnosis present

## 2015-08-29 DIAGNOSIS — E1159 Type 2 diabetes mellitus with other circulatory complications: Secondary | ICD-10-CM

## 2015-08-29 DIAGNOSIS — E46 Unspecified protein-calorie malnutrition: Secondary | ICD-10-CM | POA: Diagnosis present

## 2015-08-29 DIAGNOSIS — J690 Pneumonitis due to inhalation of food and vomit: Principal | ICD-10-CM | POA: Diagnosis present

## 2015-08-29 DIAGNOSIS — W19XXXA Unspecified fall, initial encounter: Secondary | ICD-10-CM

## 2015-08-29 DIAGNOSIS — Z91013 Allergy to seafood: Secondary | ICD-10-CM | POA: Diagnosis not present

## 2015-08-29 DIAGNOSIS — H9190 Unspecified hearing loss, unspecified ear: Secondary | ICD-10-CM | POA: Diagnosis present

## 2015-08-29 HISTORY — DX: Unspecified dementia, unspecified severity, without behavioral disturbance, psychotic disturbance, mood disturbance, and anxiety: F03.90

## 2015-08-29 HISTORY — DX: Age-related osteoporosis without current pathological fracture: M81.0

## 2015-08-29 LAB — BASIC METABOLIC PANEL
Anion gap: 11 (ref 5–15)
BUN: 23 mg/dL — AB (ref 6–20)
CALCIUM: 8.9 mg/dL (ref 8.9–10.3)
CO2: 24 mmol/L (ref 22–32)
Chloride: 98 mmol/L — ABNORMAL LOW (ref 101–111)
Creatinine, Ser: 0.7 mg/dL (ref 0.44–1.00)
GFR calc Af Amer: >60 mL/min (ref >60)
GLUCOSE: 154 mg/dL — AB (ref 65–99)
Potassium: 3.6 mmol/L (ref 3.5–5.1)
Sodium: 133 mmol/L — ABNORMAL LOW (ref 135–145)

## 2015-08-29 LAB — URINALYSIS, ROUTINE W REFLEX MICROSCOPIC
BILIRUBIN URINE: NEGATIVE
GLUCOSE, UA: NEGATIVE mg/dL
Ketones, ur: 15 mg/dL — AB
Leukocytes, UA: NEGATIVE
Nitrite: POSITIVE — AB
Protein, ur: NEGATIVE mg/dL
SPECIFIC GRAVITY, URINE: 1.024 (ref 1.005–1.030)
pH: 5 (ref 5.0–8.0)

## 2015-08-29 LAB — CBC WITH DIFFERENTIAL/PLATELET
BASOS ABS: 0 10*3/uL (ref 0.0–0.1)
BASOS PCT: 0 %
EOS PCT: 0 %
Eosinophils Absolute: 0 10*3/uL (ref 0.0–0.7)
HCT: 31.7 % — ABNORMAL LOW (ref 36.0–46.0)
Hemoglobin: 10.1 g/dL — ABNORMAL LOW (ref 12.0–15.0)
LYMPHS PCT: 3 %
Lymphs Abs: 0.7 10*3/uL (ref 0.7–4.0)
MCH: 28.8 pg (ref 26.0–34.0)
MCHC: 31.9 g/dL (ref 30.0–36.0)
MCV: 90.3 fL (ref 78.0–100.0)
MONO ABS: 1.2 10*3/uL — AB (ref 0.1–1.0)
MONOS PCT: 6 %
Neutro Abs: 18.6 10*3/uL — ABNORMAL HIGH (ref 1.7–7.7)
Neutrophils Relative %: 91 %
PLATELETS: 525 10*3/uL — AB (ref 150–400)
RBC: 3.51 MIL/uL — ABNORMAL LOW (ref 3.87–5.11)
RDW: 13.8 % (ref 11.5–15.5)
WBC: 20.4 10*3/uL — ABNORMAL HIGH (ref 4.0–10.5)

## 2015-08-29 LAB — URINE MICROSCOPIC-ADD ON: WBC UA: NONE SEEN WBC/hpf (ref 0–5)

## 2015-08-29 LAB — GLUCOSE, CAPILLARY
GLUCOSE-CAPILLARY: 255 mg/dL — AB (ref 65–99)
Glucose-Capillary: 170 mg/dL — ABNORMAL HIGH (ref 65–99)

## 2015-08-29 LAB — CBG MONITORING, ED: Glucose-Capillary: 129 mg/dL — ABNORMAL HIGH (ref 65–99)

## 2015-08-29 LAB — STREP PNEUMONIAE URINARY ANTIGEN: Strep Pneumo Urinary Antigen: NEGATIVE

## 2015-08-29 LAB — CK: CK TOTAL: 424 U/L — AB (ref 38–234)

## 2015-08-29 LAB — MRSA PCR SCREENING: MRSA BY PCR: POSITIVE — AB

## 2015-08-29 LAB — I-STAT TROPONIN, ED: Troponin i, poc: 0 ng/mL (ref 0.00–0.08)

## 2015-08-29 MED ORDER — ACETAMINOPHEN 325 MG PO TABS
650.0000 mg | ORAL_TABLET | Freq: Four times a day (QID) | ORAL | Status: DC | PRN
  Administered 2015-08-29 – 2015-09-03 (×6): 650 mg via ORAL
  Filled 2015-08-29 (×6): qty 2

## 2015-08-29 MED ORDER — MUPIROCIN 2 % EX OINT
TOPICAL_OINTMENT | Freq: Two times a day (BID) | CUTANEOUS | Status: AC
  Administered 2015-08-29 – 2015-09-02 (×8): via NASAL
  Administered 2015-09-02: 1 via NASAL
  Administered 2015-09-03: 11:00:00 via NASAL
  Filled 2015-08-29 (×4): qty 22

## 2015-08-29 MED ORDER — DEXTROSE 5 % IV SOLN
2.0000 g | Freq: Three times a day (TID) | INTRAVENOUS | Status: DC
  Filled 2015-08-29: qty 2

## 2015-08-29 MED ORDER — ACETAMINOPHEN 650 MG RE SUPP
650.0000 mg | Freq: Four times a day (QID) | RECTAL | Status: DC | PRN
Start: 2015-08-29 — End: 2015-09-03

## 2015-08-29 MED ORDER — VANCOMYCIN HCL IN DEXTROSE 1-5 GM/200ML-% IV SOLN
1000.0000 mg | INTRAVENOUS | Status: DC
  Filled 2015-08-29: qty 200

## 2015-08-29 MED ORDER — SODIUM CHLORIDE 0.9 % IV BOLUS (SEPSIS)
500.0000 mL | Freq: Once | INTRAVENOUS | Status: AC
  Administered 2015-08-29: 500 mL via INTRAVENOUS

## 2015-08-29 MED ORDER — BACITRACIN ZINC 500 UNIT/GM EX OINT
TOPICAL_OINTMENT | Freq: Two times a day (BID) | CUTANEOUS | Status: DC
  Administered 2015-08-29 – 2015-09-01 (×6): via TOPICAL
  Administered 2015-09-01 – 2015-09-02 (×2): 1 via TOPICAL
  Administered 2015-09-02 – 2015-09-03 (×2): via TOPICAL
  Filled 2015-08-29: qty 28.35

## 2015-08-29 MED ORDER — ASPIRIN EC 81 MG PO TBEC
81.0000 mg | DELAYED_RELEASE_TABLET | Freq: Every day | ORAL | Status: DC
  Administered 2015-08-29 – 2015-09-03 (×6): 81 mg via ORAL
  Filled 2015-08-29 (×7): qty 1

## 2015-08-29 MED ORDER — DEXTROSE 5 % IV SOLN
1.0000 g | Freq: Once | INTRAVENOUS | Status: AC
  Administered 2015-08-29: 1 g via INTRAVENOUS
  Filled 2015-08-29: qty 1

## 2015-08-29 MED ORDER — PANTOPRAZOLE SODIUM 40 MG PO TBEC
40.0000 mg | DELAYED_RELEASE_TABLET | Freq: Every day | ORAL | Status: DC
  Administered 2015-08-29 – 2015-09-03 (×6): 40 mg via ORAL
  Filled 2015-08-29 (×7): qty 1

## 2015-08-29 MED ORDER — LORAZEPAM 0.5 MG PO TABS
0.2500 mg | ORAL_TABLET | Freq: Two times a day (BID) | ORAL | Status: DC | PRN
  Administered 2015-08-29 – 2015-08-31 (×3): 0.25 mg via ORAL
  Filled 2015-08-29 (×5): qty 1

## 2015-08-29 MED ORDER — VANCOMYCIN HCL 500 MG IV SOLR: 500.0000 mg | INTRAVENOUS | Status: DC

## 2015-08-29 MED ORDER — SODIUM CHLORIDE 0.9 % IV SOLN
INTRAVENOUS | Status: DC
  Administered 2015-08-29 – 2015-08-30 (×2): via INTRAVENOUS

## 2015-08-29 MED ORDER — VANCOMYCIN HCL 500 MG IV SOLR
500.0000 mg | INTRAVENOUS | Status: DC
  Administered 2015-08-29 – 2015-09-02 (×5): 500 mg via INTRAVENOUS
  Filled 2015-08-29 (×5): qty 500

## 2015-08-29 MED ORDER — INSULIN ASPART 100 UNIT/ML ~~LOC~~ SOLN
0.0000 [IU] | Freq: Three times a day (TID) | SUBCUTANEOUS | Status: DC
  Administered 2015-08-29: 5 [IU] via SUBCUTANEOUS
  Administered 2015-08-30 – 2015-08-31 (×4): 2 [IU] via SUBCUTANEOUS
  Administered 2015-09-01: 1 [IU] via SUBCUTANEOUS
  Administered 2015-09-01: 3 [IU] via SUBCUTANEOUS
  Administered 2015-09-02 (×2): 2 [IU] via SUBCUTANEOUS
  Administered 2015-09-02 – 2015-09-03 (×2): 1 [IU] via SUBCUTANEOUS
  Administered 2015-09-03: 2 [IU] via SUBCUTANEOUS

## 2015-08-29 MED ORDER — VENLAFAXINE HCL ER 150 MG PO CP24
150.0000 mg | ORAL_CAPSULE | Freq: Every day | ORAL | Status: DC
  Administered 2015-08-29 – 2015-09-03 (×5): 150 mg via ORAL
  Filled 2015-08-29 (×9): qty 1

## 2015-08-29 MED ORDER — CHLORHEXIDINE GLUCONATE 0.12 % MT SOLN
15.0000 mL | Freq: Two times a day (BID) | OROMUCOSAL | Status: DC
  Administered 2015-08-29 – 2015-09-01 (×7): 15 mL via OROMUCOSAL
  Filled 2015-08-29 (×9): qty 15

## 2015-08-29 MED ORDER — CHLORHEXIDINE GLUCONATE CLOTH 2 % EX PADS
6.0000 | MEDICATED_PAD | Freq: Every day | CUTANEOUS | Status: DC
  Administered 2015-08-31 – 2015-09-03 (×3): 6 via TOPICAL

## 2015-08-29 MED ORDER — DEXTROSE 5 % IV SOLN
1.0000 g | Freq: Three times a day (TID) | INTRAVENOUS | Status: DC
  Administered 2015-08-29 – 2015-09-03 (×13): 1 g via INTRAVENOUS
  Filled 2015-08-29 (×15): qty 1

## 2015-08-29 NOTE — ED Provider Notes (Signed)
CSN: [id number]     Arrival date & time 08/29/15  2130 History   First MD Initiated Contact with Patient 08/29/15 [phone]     Chief Complaint  Patient presents with  . Fall  . Head Laceration     (Consider location/radiation/quality/duration/timing/severity/associated sxs/prior Treatment) HPI  82 year old female presents with an apparent fall. History is taken from EMS to the facility. The facility did not immediately answer a callback. Patient was noted to have blood on her floor this morning and noted to have a laceration to her for head. Staff told EMS that she has been confused for the past week with a strong urine odor. No one apparently saw her fall. The patient is confused currently and states that she did fall but cannot tell me any details or when it happened. She has multiple dressings over her extremities dated 2/19. Denies chest pain or abdominal pain. Denies a headache. Thinks she is in [place] but cannot tell me what type of a facility while she is here.  Past Medical History  Diagnosis Date  . Hypertension   . Depression   . Osteoporosis    Past Surgical History  Procedure Laterality Date  . Cataract extraction both eyes    . Fracture surgery     Family History  Problem Relation Age of Onset  . Family history unknown: Yes   Social History  Substance Use Topics  . Smoking status: Former Games developer  . Smokeless tobacco: Never Used  . Alcohol Use: No   OB History    No data available     Review of Systems    Allergies  Penicillins; Latex; and Shellfish allergy  Home Medications   Prior to Admission medications   Medication Sig Start Date End Date Taking? Authorizing Provider  aspirin EC 81 MG tablet Take 81 mg by mouth daily.   Yes Historical Provider, MD  LORazepam (ATIVAN) 0.5 MG tablet Take 0.25 mg by mouth every 12 (twelve) hours as needed for anxiety or sleep.    Yes Historical Provider, MD  metFORMIN (GLUCOPHAGE) 500 MG tablet Take 500 mg by mouth  daily with breakfast.    Yes Historical Provider, MD  omeprazole (PRILOSEC) 20 MG capsule Take 20 mg by mouth daily with breakfast.    Yes Historical Provider, MD  venlafaxine XR (EFFEXOR-XR) 150 MG 24 hr capsule Take 150 mg by mouth daily with breakfast.   Yes Historical Provider, MD  feeding supplement (BOOST / RESOURCE BREEZE) LIQD Take 1 Container by mouth 2 (two) times daily between meals. Patient not taking: Reported on 08/29/2015 03/11/15   Kathlen Mody, MD  HYDROcodone-acetaminophen (LORTAB) 5-325 MG per tablet Take 1 tablet by mouth every 6 (six) hours as needed for moderate pain. Patient not taking: Reported on 08/29/2015 03/11/15   Kirt Boys, DO  senna-docusate (SENOKOT-S) 8.6-50 MG per tablet Take 1 tablet by mouth at bedtime as needed for mild constipation. Patient not taking: Reported on 08/29/2015 03/11/15   Kathlen Mody, MD   BP 147/62 mmHg  Pulse 101  Temp(Src) 98.7 F (37.1 C) (Oral)  Resp 14  SpO2 97% Physical Exam  Constitutional: She appears well-developed and well-nourished.  HENT:  Head: Normocephalic. Head is with laceration.    Right Ear: External ear normal.  Left Ear: External ear normal.  Nose: Nose normal.  Eyes: EOM are normal. Pupils are equal, round, and reactive to light. Right eye exhibits no discharge. Left eye exhibits no discharge.  Neck: Neck supple.  Cardiovascular: Normal rate,  regular rhythm and normal heart sounds.   Pulmonary/Chest: Effort normal and breath sounds normal.  Abdominal: Soft. She exhibits no distension. There is no tenderness.  Musculoskeletal: She exhibits no tenderness.       Arms:      Legs: Limited ROM of both hips, unclear if normal for her. Does not appear in significant pain when ranging her hips  Neurological: She is alert. She is disoriented.  Equal strength in all 4 extremities. Grossly normal sensation. Awake, alert but confused  Skin: Skin is warm and dry.  Nursing note and vitals reviewed.   ED Course    Procedures (including critical care time) Labs Review Labs Reviewed  BASIC METABOLIC PANEL - Abnormal; Notable for the following:    Sodium 133 (*)    Chloride 98 (*)    Glucose, Bld 154 (*)    BUN 23 (*)    All other components within normal limits  CBC WITH DIFFERENTIAL/PLATELET - Abnormal; Notable for the following:    WBC 20.4 (*)    RBC 3.51 (*)    Hemoglobin 10.1 (*)    HCT 31.7 (*)    Platelets 525 (*)    Neutro Abs 18.6 (*)    Monocytes Absolute 1.2 (*)    All other components within normal limits  URINALYSIS, ROUTINE W REFLEX MICROSCOPIC (NOT AT [facility]) - Abnormal; Notable for the following:    Color, Urine AMBER (*)    Hgb urine dipstick TRACE (*)    Ketones, ur 15 (*)    Nitrite POSITIVE (*)    All other components within normal limits  CK - Abnormal; Notable for the following:    Total CK 424 (*)    All other components within normal limits  URINE MICROSCOPIC-ADD ON - Abnormal; Notable for the following:    Squamous Epithelial / LPF 0-5 (*)    Bacteria, UA FEW (*)    All other components within normal limits  CBG MONITORING, ED - Abnormal; Notable for the following:    Glucose-Capillary 129 (*)    All other components within normal limits  URINE CULTURE  I-STAT TROPOININ, ED    Imaging Review Ct Head Wo Contrast  08/29/2015  CLINICAL DATA:  Right forehead laceration following a fall onto the floor this morning. Altered mental status. EXAM: CT HEAD WITHOUT CONTRAST CT CERVICAL SPINE WITHOUT CONTRAST TECHNIQUE: Multidetector CT imaging of the head and cervical spine was performed following the standard protocol without intravenous contrast. Multiplanar CT image reconstructions of the cervical spine were also generated. COMPARISON:  Head CT dated 08/07/2014. FINDINGS: CT HEAD FINDINGS Diffusely enlarged ventricles and subarachnoid spaces. Patchy white matter low density in both cerebral hemispheres. Stable small amount of calcification within an area of low density in  the left frontal white matter. No skull fracture, intracranial hemorrhage or paranasal sinus air-fluid levels. CT CERVICAL SPINE FINDINGS Mild levoconvex scoliosis. Multilevel degenerative changes. Mild partially calcified pannus around the dens. No prevertebral soft tissue swelling, fractures or subluxations. Bullous changes in both upper lobes with biapical pleural and parenchymal scarring, greater on the right. Bilateral carotid artery calcifications. IMPRESSION: 1. No skull fracture or intracranial hemorrhage. 2. No cervical spine fracture or subluxation. 3. Stable diffuse cerebral and cerebellar atrophy. 4. Mildly progressive chronic small vessel white matter ischemic changes in both cerebral hemispheres. 5. Stable left frontal lobe dystrophic calcification. 6. Bilateral carotid artery atheromatous calcifications. 7. COPD. Electronically Signed   By: Beckie Salts M.D.   On: 08/29/2015 10:40  Ct Cervical Spine Wo Contrast  08/29/2015  CLINICAL DATA:  Right forehead laceration following a fall onto the floor this morning. Altered mental status. EXAM: CT HEAD WITHOUT CONTRAST CT CERVICAL SPINE WITHOUT CONTRAST TECHNIQUE: Multidetector CT imaging of the head and cervical spine was performed following the standard protocol without intravenous contrast. Multiplanar CT image reconstructions of the cervical spine were also generated. COMPARISON:  Head CT dated 08/07/2014. FINDINGS: CT HEAD FINDINGS Diffusely enlarged ventricles and subarachnoid spaces. Patchy white matter low density in both cerebral hemispheres. Stable small amount of calcification within an area of low density in the left frontal white matter. No skull fracture, intracranial hemorrhage or paranasal sinus air-fluid levels. CT CERVICAL SPINE FINDINGS Mild levoconvex scoliosis. Multilevel degenerative changes. Mild partially calcified pannus around the dens. No prevertebral soft tissue swelling, fractures or subluxations. Bullous changes in both  upper lobes with biapical pleural and parenchymal scarring, greater on the right. Bilateral carotid artery calcifications. IMPRESSION: 1. No skull fracture or intracranial hemorrhage. 2. No cervical spine fracture or subluxation. 3. Stable diffuse cerebral and cerebellar atrophy. 4. Mildly progressive chronic small vessel white matter ischemic changes in both cerebral hemispheres. 5. Stable left frontal lobe dystrophic calcification. 6. Bilateral carotid artery atheromatous calcifications. 7. COPD. Electronically Signed   By: Beckie Salts M.D.   On: 08/29/2015 10:40   Dg Hips Bilat With Pelvis 2v  08/29/2015  CLINICAL DATA:  82 year old female with fall and bilateral hip pain. EXAM: DG HIP (WITH OR WITHOUT PELVIS) 2V BILAT COMPARISON:  CT of the right lower extremity dated 03/10/2015 go and multiple radiograph dated back to 08/06/2014 FINDINGS: No acute fracture identified. There is a total right hip arthroplasty. No dislocation. There is old fracture of the greater trochanter of the right femur as well as old fracture of the proximal right femur. There is also old fracture of the greater trochanter of the left femur. The bones are osteopenic. There is moderate stool throughout the colon. The soft tissues are otherwise grossly unremarkable. IMPRESSION: No acute fracture or dislocation. Total right hip arthroplasty. Electronically Signed   By: Elgie Collard M.D.   On: 08/29/2015 10:56   I have personally reviewed and evaluated these images and lab results as part of my medical decision-making.   EKG Interpretation   Date/Time:  Monday August 29 2015 09:35:13 EST Ventricular Rate:  85 PR Interval:  134 QRS Duration: 86 QT Interval:  380 QTC Calculation: 452 R Axis:   64 Text Interpretation:  Sinus rhythm Anteroseptal infarct, age indeterminate  Baseline wander in lead(s) V3 No old tracing to compare Confirmed by  Mardene Lessig  MD, Khalon Cansler ([id number]) on 08/29/2015 9:45:09 AM      MDM   Final  diagnoses:  Fall, initial encounter  Forehead laceration, initial encounter  UTI (lower urinary tract infection)    Patient's workup shows no acute intracranial injury. Patient's lab work is unremarkable except for a WBC of 20. Her urinalysis does not have white blood cells or bacteria but is positive for nitrites. Given family and staff concern for possible UTI with increased urine output/frequency she will be treated as a urinary tract infection. Daughter at the bedside states the patient has been progressively declining for some time but seems to be rapidly declining over the last 1 week or so mentally. Due to this we'll treat as a UTI and consider this altered mental status and admit to the hospital. Patient is a DO NOT RESUSCITATE per daughter.    Pricilla Loveless, MD  08/29/15 1150 

## 2015-08-29 NOTE — Progress Notes (Addendum)
Pharmacy Antibiotic Note  23 yoF with hx dementia from ALF with worsening confusion and fall.  U/A +nitrites, few bacteria.  CXR shows right rib fractures without pneumothorax and an area of atelectasis vs consolidation with pleural effusion.  Pharmacy consulted to start Vancomycin for possible PNA and UTI. MD dosing aztreonam due to PCN allergies (rxn unknown).    Plan: Vancomycin 1g x 1 then  IV q24h  Aztreonam 1g IV q8h F/u renal fxn, updated weight, cultures    Temp (24hrs), Avg:98.8 F (37.1 C), Min:98.7 F (37.1 C), Max:98.9 F (37.2 C)   Recent Labs Lab 08/29/15 1001  WBC 20.4*  CREATININE 0.70    CrCl cannot be calculated (Unknown ideal weight.).    Allergies  Allergen Reactions  . Penicillins Swelling    Has patient had a PCN reaction causing immediate rash, facial/tongue/throat swelling, SOB or lightheadedness with hypotension: unknown Has patient had a PCN reaction causing severe rash involving mucus membranes or skin necrosis: unknown Has patient had a PCN reaction that required hospitalization: unknown Has patient had a PCN reaction occurring within the last 10 years: unknown If all of the above answers are "NO", then may proceed with Cephalosporin use.   . Latex Hives  . Shellfish Allergy     Develops gout with frequent consumption of shellfish    Antimicrobials this admission: 2/20 >> Vancomycin >> 2/20 >> Aztreonam >>   Dose adjustments this admission: N/A  Microbiology results: 2/20 BCx: Collected 2/20 UCx: Collected 2/20 Strep pneumo urinary antigen: ordered   Thank you for allowing pharmacy to be a part of this patient's care.  Haynes Hoehn, PharmD, BCPS 08/29/2015, 3:49 PM  Pager: [phone]   Addendum: Weight updated to 37.6kg, CrCl 30 ml/min.  Adjusted Vancomycin dose to  IV q24h - NO loading dose.

## 2015-08-29 NOTE — ED Notes (Signed)
ISTAT troponin = 0.00

## 2015-08-29 NOTE — ED Notes (Signed)
Bed: ZO10 Expected date:  Expected time:  Means of arrival:  Comments: EMS fall-laceration

## 2015-08-29 NOTE — H&P (Signed)
History and Physical:    Dawn Kaufman   ZOX:[id number] DOB: 04-18-1927 DOA: 08/29/2015  Referring MD/provider: Pricilla Loveless, M.D. PCP: Kirstie Peri, MD   Chief Complaint: Worsening confusion 1 week and fall  History of Present Illness:   Dawn Kaufman is an 82 y.o. female the history of dementia who resides in an assisted living facility. According to the patient's daughter who is the historian about a week ago, her mobility diminished requiring her to use a wheelchair instead of a walker. She's also had some slowly progressive worsening confusion and a decreased appetite over several days. Today she attempted to stand from her wheelchair became unsteady on her feet and sustained a fall. This resulted in a laceration to the forehead thus she was transfered to the emergency room. In addition to her dementia the patient is also hard of hearing currently does not have her hearing aids available. She is unable to contribute much to the history of present illness due both to her decreased hearing and her confusion. According to the patient's daughter who is also her healthcare power of attorney her CODE STATUS is DO NOT RESUSCITATE. However she does want treatable conditions treated.  The patient is unable to contribute any meaningful information regarding her current condition. However she does groan and winces in  pain when moved. In the emergency room laboratory data revealed an elevated white blood cell count. Urinalysis also showed elements consistent with urinary tract infection. A chest x-ray showed right rib fractures without pneumothorax. It is unclear whether these are unhealed fractures in the same region from September or whether these are new fractures that have been sustained recently. The chest x-ray also shows an area of atelectasis versus consolidation. However there was also a pleural effusion in the same area and thus I am treating this is a pneumonia. The patient is  currently in the process of receiving a dose of history and exam due to her penicillin allergy as treatment for the urinary tract infection.  The patient's daughter reports that she's had no fever, chills, vomiting, diarrhea. However her oral intake has been less than normal. She states that the patient normally has a very good appetite except for the last 2 weeks. Please note the patient's been recently moved to a new living arrangement in the last 2 weeks and she does have a history of mild dementia.  I have reviewed the records sent over by the nursing facility which essentially includes a medication list. Her problem list shows diabetes and dementia as current problems.  ROS:   Review of Systems  Unable to perform ROS    Past Medical History:   Past Medical History  Diagnosis Date  . Hypertension   . Depression   . Osteoporosis     Past Surgical History:   Past Surgical History  Procedure Laterality Date  . Cataract extraction both eyes    . Fracture surgery      Social History:   Social History   Social History  . Marital Status: Widowed    Spouse Name: N/A  . Number of Children: N/A  . Years of Education: N/A   Occupational History  . Not on file.   Social History Main Topics  . Smoking status: Former Games developer  . Smokeless tobacco: Never Used  . Alcohol Use: No  . Drug Use: No  . Sexual Activity: Not on file   Other Topics Concern  . Not on file   Social History Narrative  Family history:   Family History  Problem Relation Age of Onset  . Family history unknown: Yes    Allergies   Penicillins; Latex; and Shellfish allergy  Current Medications:   Prior to Admission medications   Medication Sig Start Date End Date Taking? Authorizing Provider  aspirin EC 81 MG tablet Take 81 mg by mouth daily.   Yes Historical Provider, MD  LORazepam (ATIVAN) 0.5 MG tablet Take 0.25 mg by mouth every 12 (twelve) hours as needed for anxiety or sleep.    Yes  Historical Provider, MD  metFORMIN (GLUCOPHAGE) 500 MG tablet Take 500 mg by mouth daily with breakfast.    Yes Historical Provider, MD  omeprazole (PRILOSEC) 20 MG capsule Take 20 mg by mouth daily with breakfast.    Yes Historical Provider, MD  venlafaxine XR (EFFEXOR-XR) 150 MG 24 hr capsule Take 150 mg by mouth daily with breakfast.   Yes Historical Provider, MD  feeding supplement (BOOST / RESOURCE BREEZE) LIQD Take 1 Container by mouth 2 (two) times daily between meals. Patient not taking: Reported on 08/29/2015 03/11/15   Kathlen Mody, MD  HYDROcodone-acetaminophen (LORTAB) 5-325 MG per tablet Take 1 tablet by mouth every 6 (six) hours as needed for moderate pain. Patient not taking: Reported on 08/29/2015 03/11/15   Kirt Boys, DO  senna-docusate (SENOKOT-S) 8.6-50 MG per tablet Take 1 tablet by mouth at bedtime as needed for mild constipation. Patient not taking: Reported on 08/29/2015 03/11/15   Kathlen Mody, MD    Physical Exam:   Filed Vitals:   08/29/15 0845 08/29/15 0846 08/29/15 1110 08/29/15 1225  BP:  140/71 147/62 136/69  Pulse:  83 101 89  Temp:  98.7 F (37.1 C) 98.7 F (37.1 C) 98.8 F (37.1 C)  TempSrc:  Oral Oral Oral  Resp:  13 14 12   SpO2: 97% 94% 97% 95%     Physical Exam: Blood pressure 136/69, pulse 89, temperature 98.8 F (37.1 C), temperature source Oral, resp. rate 12, SpO2 95 %.   General: Alert, awake, oriented to name only. She does not appear to be in any distress unless moved then she cries out in pain. She is frail but nontoxic appearing  HEENT: [place]/AT PEERL, EOMI, anicteric patient has a small laceration present in the center of her forehead Neck: Trachea midline, no masses, no thyromegal,y no JVD, no carotid bruit OROPHARYNX: Moist, No exudate/ erythema/lesions.  Heart: Regular rate and rhythm, without murmurs, rubs, gallops or S3. PMI non-displaced. Exam reveals no decreased pulses. Pulmonary/Chest: Normal effort. Breath sounds normal. No.  Apnea. Clear to auscultation,no stridor,  no wheezing and no rhonchi noted. No respiratory distress and no tenderness noted. Abdomen: Soft, nontender, nondistended, normal bowel sounds, no masses no hepatosplenomegaly noted. No fluid wave and no ascites. There is no guarding or rebound. Neuro: Alert and oriented to person, place and time. Normal motor skills, Displays no atrophy or tremors and exhibits normal muscle tone.  No focal neurological deficits noted cranial nerves II through XII grossly intact. No sensory deficit noted. Unable to assess strength and gait due to patient's state of mobility.  Musculoskeletal: No warm swelling or erythema around joints, no spinal tenderness noted. Lymph node survey: No cervical axillary or inguinal lymphadenopathy noted. Skin: Skin is warm and dry. She has bruises of various ages occurring in the right antecubital region as well as over both knees and the anterior portion of the legs. Psychiatric: mild confusion present     Data Review:  Labs: Basic Metabolic Panel:  Recent Labs Lab 08/29/15 1001  NA 133*  K 3.6  CL 98*  CO2 24  GLUCOSE 154*  BUN 23*  CREATININE 0.70  CALCIUM 8.9   Liver Function Tests: No results for input(s): AST, ALT, ALKPHOS, BILITOT, PROT, ALBUMIN in the last 168 hours. No results for input(s): LIPASE, AMYLASE in the last 168 hours. No results for input(s): AMMONIA in the last 168 hours. CBC:  Recent Labs Lab 08/29/15 1001  WBC 20.4*  NEUTROABS 18.6*  HGB 10.1*  HCT 31.7*  MCV 90.3  PLT 525*   Cardiac Enzymes:  Recent Labs Lab 08/29/15 1001  CKTOTAL 424*    BNP (last 3 results) No results for input(s): PROBNP in the last 8760 hours. CBG:  Recent Labs Lab 08/29/15 1039  GLUCAP 129*    Radiographic Studies: Ct Head Wo Contrast  08/29/2015  CLINICAL DATA:  Right forehead laceration following a fall onto the floor this morning. Altered mental status. EXAM: CT HEAD WITHOUT CONTRAST CT CERVICAL  SPINE WITHOUT CONTRAST TECHNIQUE: Multidetector CT imaging of the head and cervical spine was performed following the standard protocol without intravenous contrast. Multiplanar CT image reconstructions of the cervical spine were also generated. COMPARISON:  Head CT dated 08/07/2014. FINDINGS: CT HEAD FINDINGS Diffusely enlarged ventricles and subarachnoid spaces. Patchy white matter low density in both cerebral hemispheres. Stable small amount of calcification within an area of low density in the left frontal white matter. No skull fracture, intracranial hemorrhage or paranasal sinus air-fluid levels. CT CERVICAL SPINE FINDINGS Mild levoconvex scoliosis. Multilevel degenerative changes. Mild partially calcified pannus around the dens. No prevertebral soft tissue swelling, fractures or subluxations. Bullous changes in both upper lobes with biapical pleural and parenchymal scarring, greater on the right. Bilateral carotid artery calcifications. IMPRESSION: 1. No skull fracture or intracranial hemorrhage. 2. No cervical spine fracture or subluxation. 3. Stable diffuse cerebral and cerebellar atrophy. 4. Mildly progressive chronic small vessel white matter ischemic changes in both cerebral hemispheres. 5. Stable left frontal lobe dystrophic calcification. 6. Bilateral carotid artery atheromatous calcifications. 7. COPD. Electronically Signed   By: Beckie Salts M.D.   On: 08/29/2015 10:40   Ct Cervical Spine Wo Contrast  08/29/2015  CLINICAL DATA:  Right forehead laceration following a fall onto the floor this morning. Altered mental status. EXAM: CT HEAD WITHOUT CONTRAST CT CERVICAL SPINE WITHOUT CONTRAST TECHNIQUE: Multidetector CT imaging of the head and cervical spine was performed following the standard protocol without intravenous contrast. Multiplanar CT image reconstructions of the cervical spine were also generated. COMPARISON:  Head CT dated 08/07/2014. FINDINGS: CT HEAD FINDINGS Diffusely enlarged  ventricles and subarachnoid spaces. Patchy white matter low density in both cerebral hemispheres. Stable small amount of calcification within an area of low density in the left frontal white matter. No skull fracture, intracranial hemorrhage or paranasal sinus air-fluid levels. CT CERVICAL SPINE FINDINGS Mild levoconvex scoliosis. Multilevel degenerative changes. Mild partially calcified pannus around the dens. No prevertebral soft tissue swelling, fractures or subluxations. Bullous changes in both upper lobes with biapical pleural and parenchymal scarring, greater on the right. Bilateral carotid artery calcifications. IMPRESSION: 1. No skull fracture or intracranial hemorrhage. 2. No cervical spine fracture or subluxation. 3. Stable diffuse cerebral and cerebellar atrophy. 4. Mildly progressive chronic small vessel white matter ischemic changes in both cerebral hemispheres. 5. Stable left frontal lobe dystrophic calcification. 6. Bilateral carotid artery atheromatous calcifications. 7. COPD. Electronically Signed   By: Beckie Salts M.D.   On:  08/29/2015 10:40   Dg Chest Portable 1 View  08/29/2015  CLINICAL DATA:  Unwitnessed fall today; no chest complaints per pt; per Staff at nursing home: Staff stated increased confusion for the past week and patient had a strong urine odor.; HTN; ex smoker EXAM: PORTABLE CHEST - 1 VIEW COMPARISON:  03/09/2015 FINDINGS: Fracture of the anterolateral aspect right sixth and seventh ribs, not evident on the prior study. Right pleural effusion with adjacent consolidation/ atelectasis in the right lower lung. No pneumothorax. Left lung clear. Heart size upper limits normal for technique. Atheromatous aortic arch. IMPRESSION: 1. Right rib fractures without pneumothorax. 2. Right pleural effusion with lower lung consolidation/atelectasis. Electronically Signed   By: Corlis Leak M.D.   On: 08/29/2015 12:40   Dg Hips Bilat With Pelvis 2v  08/29/2015  CLINICAL DATA:  82 year old  female with fall and bilateral hip pain. EXAM: DG HIP (WITH OR WITHOUT PELVIS) 2V BILAT COMPARISON:  CT of the right lower extremity dated 03/10/2015 go and multiple radiograph dated back to 08/06/2014 FINDINGS: No acute fracture identified. There is a total right hip arthroplasty. No dislocation. There is old fracture of the greater trochanter of the right femur as well as old fracture of the proximal right femur. There is also old fracture of the greater trochanter of the left femur. The bones are osteopenic. There is moderate stool throughout the colon. The soft tissues are otherwise grossly unremarkable. IMPRESSION: No acute fracture or dislocation. Total right hip arthroplasty. Electronically Signed   By: Elgie Collard M.D.   On: 08/29/2015 10:56   *I have personally reviewed the images above. Additionally I have spoken with Dr. Deanne Coffer regarding the chest x-ray in the context of evaluating the rib fractures. He agrees that it is difficult to tell whether the rib fractures are acute or nonhealing from previous fractures.    Assessment/Plan:   Active Problems:  Confusion: Sub-acute: It is unclear whether the worsening confusion is the natural progression of her dementia whether it is impacted by an acute condition. I will treat presumptively for both a urinary tract infection and pneumonia pending the cultures of the urine. And will observe for clinical improvement. I have discussed with her daughter, and this is also the daughter's concern that patient is declining with regard to her dementia and may also have an acute worsening of her dementia due to the recent move to a new location and environment.  UTI (lower urinary tract infection): Urinalysis has elements consistent with urinary tract infection particularly positive for nitrites. She does have an allergy to penicillin and will be treated with history and exam pending the results of the urine culture.  Suspect Pneumonia: It is difficult  to tell whether the chest x-ray findings represent pneumonia versus atelectasis given its unilateral nature. However the concurrent pleural effusion surrounding that area raises the suspicion for pneumonia and thus I will treat her for healthcare acquired pneumonia given her living situation and exposure to possible healthcare acquired pathogens.  Leukocytosis: Resulted from the infection and the mild dehydration. With treatment I expect to see a decrease in the white blood cell count.  Mild Dehydration: It is clear from the patient's clinical presentation that she does have some degree of dehydration. She's been started on gentle IV fluids and we will reassess.  Gait abnormality: I consider the patient's gait abnormality a part of her current condition as this developed within the last 2 weeks during the same time period that the patient developed confusion and  decreased oral intake. I will ask physical therapy to evaluate the patient's age abnormality and make recommendations within the next 48 hours.  DM II: Based on the medication list that the patient has she takes on the Glucophage for her diabetes type 2. I'm unsure how high her blood sugars get as her daughter was not able to provide information. Thus at this time I will hold her Glucophage and place her on a sliding scale to evaluate blood sugars for the next 24 hours. Thereafter a decision should be made as to whether the patient should resume the Glucophage during her hospitalization or continue with sliding scale insulin until she is medically improved.  Status post fall: Patient sustained a laceration to the forehead. No current bleeding. Will apply triple antibiotic ointment and continue to observe.    DVT prophylaxis - SCD's ordered in light of recent fall and laceration.  Code Status / Family Communication / Disposition Plan:   Code Status: DO NOT RESUSCITATE Family Communication: Spoke with her daughter Ms. Mayford Knife and updated  her on the patient's current condition. Disposition Plan: Not yet determined.  Attestation regarding necessity of inpatient status:   The appropriate admission status for this patient is INPATIENT. Inpatient status is judged to be reasonable and necessary in order to provide the required intensity of service to ensure the patient's safety. The patient's presenting symptoms, physical exam findings, and initial radiographic and laboratory data in the context of their chronic comorbidities is felt to place them at high risk for further clinical deterioration. Furthermore, it is not anticipated that the patient will be medically stable for discharge from the hospital within 2 midnights of admission. The following factors support the admission status of inpatient.   -The patient's presenting symptoms include worsening confusion, fall, gait abnormality. - The worrisome physical exam findings include laceration to forhead. - The initial radiographic and laboratory data are worrisome because of possible pneumonia with pleural effusion, possible urinary tract infection. - The chronic co-morbidities include dementia, diabetes type 2.. - Patient requires inpatient status due to high intensity of service, high risk for further deterioration and high frequency of surveillance required. - I certify that at the point of admission it is my clinical judgment that the patient will require inpatient hospital care spanning beyond 2 midnights from the point of admission.   Time spent: 70 minutes. More than 50% of the time spent in assessment, discussion, and coordination of care.  MATTHEWS,MICHELLE A. Pager [phone]   If 7PM-7AM, please contact night-coverage [email or web address] Password [email or web address] 08/29/2015, 1:05 PM

## 2015-08-29 NOTE — ED Notes (Signed)
Per EMS- Patient is a resident of [facility]. Staff noted blood on the floor this AM and patient had a laceration to the right forehead area. Patient had gotten herself up and returned to bed. Staff stated increased confusion for the past week and patient had a strong urine odor.

## 2015-08-30 DIAGNOSIS — S2241XS Multiple fractures of ribs, right side, sequela: Secondary | ICD-10-CM

## 2015-08-30 LAB — GLUCOSE, CAPILLARY
GLUCOSE-CAPILLARY: 152 mg/dL — AB (ref 65–99)
GLUCOSE-CAPILLARY: 97 mg/dL (ref 65–99)
Glucose-Capillary: 215 mg/dL — ABNORMAL HIGH (ref 65–99)

## 2015-08-30 LAB — CBC WITH DIFFERENTIAL/PLATELET
BASOS PCT: 0 %
Basophils Absolute: 0 10*3/uL (ref 0.0–0.1)
Eosinophils Absolute: 0 10*3/uL (ref 0.0–0.7)
Eosinophils Relative: 0 %
HEMATOCRIT: 30.5 % — AB (ref 36.0–46.0)
Hemoglobin: 10 g/dL — ABNORMAL LOW (ref 12.0–15.0)
LYMPHS ABS: 1 10*3/uL (ref 0.7–4.0)
Lymphocytes Relative: 5 %
MCH: 29.9 pg (ref 26.0–34.0)
MCHC: 32.8 g/dL (ref 30.0–36.0)
MCV: 91 fL (ref 78.0–100.0)
MONO ABS: 1.2 10*3/uL — AB (ref 0.1–1.0)
MONOS PCT: 6 %
NEUTROS ABS: 19.4 10*3/uL — AB (ref 1.7–7.7)
Neutrophils Relative %: 89 %
Platelets: 479 10*3/uL — ABNORMAL HIGH (ref 150–400)
RBC: 3.35 MIL/uL — ABNORMAL LOW (ref 3.87–5.11)
RDW: 13.8 % (ref 11.5–15.5)
WBC: 21.6 10*3/uL — ABNORMAL HIGH (ref 4.0–10.5)

## 2015-08-30 LAB — BASIC METABOLIC PANEL
ANION GAP: 9 (ref 5–15)
BUN: 22 mg/dL — ABNORMAL HIGH (ref 6–20)
CALCIUM: 8.7 mg/dL — AB (ref 8.9–10.3)
CHLORIDE: 105 mmol/L (ref 101–111)
CO2: 25 mmol/L (ref 22–32)
CREATININE: 0.69 mg/dL (ref 0.44–1.00)
GFR calc Af Amer: >60 mL/min (ref >60)
GFR calc non Af Amer: >60 mL/min (ref >60)
GLUCOSE: 179 mg/dL — AB (ref 65–99)
Potassium: 3.4 mmol/L — ABNORMAL LOW (ref 3.5–5.1)
Sodium: 139 mmol/L (ref 135–145)

## 2015-08-30 LAB — TSH: TSH: 0.616 u[IU]/mL (ref 0.350–4.500)

## 2015-08-30 LAB — HIV ANTIBODY (ROUTINE TESTING W REFLEX): HIV Screen 4th Generation wRfx: NONREACTIVE

## 2015-08-30 NOTE — Progress Notes (Addendum)
Patient Demographics  Dawn Kaufman, is a 82 y.o. female, DOB - [birth year], WUJ:[id number]  Admit date - 08/29/2015   Admitting Physician Altha Harm, MD  Outpatient Primary MD for the patient is [facility], MD  LOS - 1   Chief Complaint  Patient presents with  . Fall  . Head Laceration       Admission HPI/Brief narrative: 82 year old female with history of baseline dementia, hypertension, diabetes, depression, right hip arthroplasty resents with altered mental status and recent multiple falls, most likely due to infectious process including pneumonia and UTI.  Subjective:   Dawn Kaufman today has, No headache, No chest pain, No abdominal pain , No Cough or shortness of breath.  Assessment & Plan    Active Problems:   Closed fracture of seven ribs of right side   Head injury   Diabetes mellitus, type 2 (HCC)   UTI (lower urinary tract infection)   Healthcare-associated pneumonia  Acute encephalopathy - Patient with baseline dementia, mental status complicated by metabolic encephalopathy secondary to infectious process from UTI and pneumonia. - CT head with no acute findings  UTI - Urine culture growing Escherichia coli, continue with IV Azactam, follow on sensitivity  Pneumonia - Patient was noticed with cough while eating, chest x-ray with evidence of right pleural effusion/consolidation, suspicious for aspiration. - Continue with IV vancomycin (MRSA PCR +) and Azactam - Mild risk for aspiration by SLP  Diabetes mellitus - CBG uncontrolled, continue with insulin sliding scale, continue to hold metformin  Generalized weakness and deconditioning - Recent multiple falls, with evidence of rib fracture on x-ray(chronicity unclear giving multiple falls), and head trauma on presentation. - Continue with incentive spirometry - We'll consult PT  protein calorie  malnutrition - will consult nutrition, will start on ensure  Code Status: DNR  Family Communication: D/W daughter by phone.  Disposition Plan: Pending PT consult, she is at ALF   Procedures  none   Consults   none   Medications  Scheduled Meds: . aspirin EC  81 mg Oral Daily  . aztreonam  1 g Intravenous 3 times per day  . bacitracin   Topical BID  . chlorhexidine  15 mL Mouth/Throat BID  . Chlorhexidine Gluconate Cloth  6 each Topical Q0600  . insulin aspart  0-9 Units Subcutaneous TID WC  . mupirocin ointment   Nasal BID  . pantoprazole  40 mg Oral Daily  . vancomycin  500 mg Intravenous Q24H  . venlafaxine XR  150 mg Oral Q breakfast   Continuous Infusions: . sodium chloride 50 mL/hr at 08/29/15 1630   PRN Meds:.acetaminophen **OR** acetaminophen, LORazepam  DVT Prophylaxis  Heparin - SCDs  Lab Results  Component Value Date   PLT 479* 08/30/2015    Antibiotics    Anti-infectives    Start     Dose/Rate Route Frequency Ordered Stop   08/30/15 1600  vancomycin (VANCOCIN) 500 mg in sodium chloride 0.9 % 100 mL IVPB  Status:  Discontinued     500 mg 100 mL/hr over 60 Minutes Intravenous Every 24 hours 08/29/15 1555 08/29/15 1632   08/29/15 2200  aztreonam (AZACTAM) 1 g in dextrose 5 % 50 mL IVPB     1 g 100 mL/hr over 30  Minutes Intravenous 3 times per day 08/29/15 1555 09/06/15 1359   08/29/15 1700  vancomycin (VANCOCIN) 500 mg in sodium chloride 0.9 % 100 mL IVPB     500 mg 100 mL/hr over 60 Minutes Intravenous Every 24 hours 08/29/15 1632     08/29/15 1600  vancomycin (VANCOCIN) IVPB 1000 mg/200 mL premix  Status:  Discontinued     1,000 mg 200 mL/hr over 60 Minutes Intravenous NOW 08/29/15 1555 08/29/15 1632   08/29/15 1530  aztreonam (AZACTAM) 2 g in dextrose 5 % 50 mL IVPB  Status:  Discontinued     2 g 100 mL/hr over 30 Minutes Intravenous 3 times per day 08/29/15 1529 08/29/15 1555   08/29/15 1145  aztreonam (AZACTAM) 1 g in dextrose 5 % 50 mL IVPB      1 g 100 mL/hr over 30 Minutes Intravenous  Once 08/29/15 1138 08/29/15 1401          Objective:   Filed Vitals:   08/29/15 1441 08/29/15 1615 08/29/15 1624 08/30/15 0537  BP: 144/68 132/61  140/60  Pulse: 85 79  89  Temp: 98.9 F (37.2 C) 98.2 F (36.8 C)  98 F (36.7 C)  TempSrc: Oral Oral  Oral  Resp: 16 16  17   Height:   5\' 4"  (1.626 m)   Weight:   37.6 kg (82 lb 14.3 oz)   SpO2: 95% 95%  93%    Wt Readings from Last 3 Encounters:  08/29/15 37.6 kg (82 lb 14.3 oz)  03/24/15 44.906 kg (99 lb)  03/15/15 44.906 kg (99 lb)     Intake/Output Summary (Last 24 hours) at 08/30/15 1719 Last data filed at 08/30/15 0824  Gross per 24 hour  Intake    210 ml  Output    250 ml  Net    -40 ml     Physical Exam  Awake Alert, Oriented oriented to self and place, frail, cachectic Supple Neck,No JVD,  Symmetrical Chest wall movement, Good air movement bilaterall RRR,No Gallops,Rubs or new Murmurs, No Parasternal Heave +ve B.Sounds, Abd Soft, No tenderness,  No Cyanosis, Clubbing or edema, No new Rash.   Data Review   Micro Results Recent Results (from the past 240 hour(s))  Urine culture     Status: None (Preliminary result)   Collection Time: 08/29/15 10:50 AM  Result Value Ref Range Status   Specimen Description URINE, CATHETERIZED  Final   Special Requests NONE  Final   Culture   Final    >=100,000 COLONIES/mL ESCHERICHIA COLI Performed at [facility]    Report Status PENDING  Incomplete  Culture, blood (routine x 2) Call MD if unable to obtain prior to antibiotics being given     Status: None (Preliminary result)   Collection Time: 08/29/15  4:23 PM  Result Value Ref Range Status   Specimen Description BLOOD LEFT ARM  Final   Special Requests IN PEDIATRIC BOTTLE  4CC  Final   Culture   Final    NO GROWTH < 24 HOURS Performed at [facility]    Report Status PENDING  Incomplete  Culture, blood (routine x 2) Call MD if unable to obtain  prior to antibiotics being given     Status: None (Preliminary result)   Collection Time: 08/29/15  5:58 PM  Result Value Ref Range Status   Specimen Description BLOOD LEFT ARM  Final   Special Requests BOTTLES DRAWN AEROBIC AND ANAEROBIC  10CC  Final   Culture  Final    NO GROWTH < 24 HOURS Performed at [facility]    Report Status PENDING  Incomplete  MRSA PCR Screening     Status: Abnormal   Collection Time: 08/29/15  6:30 PM  Result Value Ref Range Status   MRSA by PCR POSITIVE (A) NEGATIVE Final    Comment:        The GeneXpert MRSA Assay (FDA approved for NASAL specimens only), is one component of a comprehensive MRSA colonization surveillance program. It is not intended to diagnose MRSA infection nor to guide or monitor treatment for MRSA infections. RESULT CALLED TO, READ BACK BY AND VERIFIED WITH: Alvia Grove [id number] @ 2021 BY J SCOTTON     Radiology Reports Ct Head Wo Contrast  08/29/2015  CLINICAL DATA:  Right forehead laceration following a fall onto the floor this morning. Altered mental status. EXAM: CT HEAD WITHOUT CONTRAST CT CERVICAL SPINE WITHOUT CONTRAST TECHNIQUE: Multidetector CT imaging of the head and cervical spine was performed following the standard protocol without intravenous contrast. Multiplanar CT image reconstructions of the cervical spine were also generated. COMPARISON:  Head CT dated 08/07/2014. FINDINGS: CT HEAD FINDINGS Diffusely enlarged ventricles and subarachnoid spaces. Patchy white matter low density in both cerebral hemispheres. Stable small amount of calcification within an area of low density in the left frontal white matter. No skull fracture, intracranial hemorrhage or paranasal sinus air-fluid levels. CT CERVICAL SPINE FINDINGS Mild levoconvex scoliosis. Multilevel degenerative changes. Mild partially calcified pannus around the dens. No prevertebral soft tissue swelling, fractures or subluxations. Bullous changes in both upper  lobes with biapical pleural and parenchymal scarring, greater on the right. Bilateral carotid artery calcifications. IMPRESSION: 1. No skull fracture or intracranial hemorrhage. 2. No cervical spine fracture or subluxation. 3. Stable diffuse cerebral and cerebellar atrophy. 4. Mildly progressive chronic small vessel white matter ischemic changes in both cerebral hemispheres. 5. Stable left frontal lobe dystrophic calcification. 6. Bilateral carotid artery atheromatous calcifications. 7. COPD. Electronically Signed   By: Beckie Salts M.D.   On: 08/29/2015 10:40   Ct Cervical Spine Wo Contrast  08/29/2015  CLINICAL DATA:  Right forehead laceration following a fall onto the floor this morning. Altered mental status. EXAM: CT HEAD WITHOUT CONTRAST CT CERVICAL SPINE WITHOUT CONTRAST TECHNIQUE: Multidetector CT imaging of the head and cervical spine was performed following the standard protocol without intravenous contrast. Multiplanar CT image reconstructions of the cervical spine were also generated. COMPARISON:  Head CT dated 08/07/2014. FINDINGS: CT HEAD FINDINGS Diffusely enlarged ventricles and subarachnoid spaces. Patchy white matter low density in both cerebral hemispheres. Stable small amount of calcification within an area of low density in the left frontal white matter. No skull fracture, intracranial hemorrhage or paranasal sinus air-fluid levels. CT CERVICAL SPINE FINDINGS Mild levoconvex scoliosis. Multilevel degenerative changes. Mild partially calcified pannus around the dens. No prevertebral soft tissue swelling, fractures or subluxations. Bullous changes in both upper lobes with biapical pleural and parenchymal scarring, greater on the right. Bilateral carotid artery calcifications. IMPRESSION: 1. No skull fracture or intracranial hemorrhage. 2. No cervical spine fracture or subluxation. 3. Stable diffuse cerebral and cerebellar atrophy. 4. Mildly progressive chronic small vessel white matter ischemic  changes in both cerebral hemispheres. 5. Stable left frontal lobe dystrophic calcification. 6. Bilateral carotid artery atheromatous calcifications. 7. COPD. Electronically Signed   By: Beckie Salts M.D.   On: 08/29/2015 10:40   Dg Chest Portable 1 View  08/29/2015  CLINICAL DATA:  Unwitnessed fall today; no  chest complaints per pt; per Staff at nursing home: Staff stated increased confusion for the past week and patient had a strong urine odor.; HTN; ex smoker EXAM: PORTABLE CHEST - 1 VIEW COMPARISON:  03/09/2015 FINDINGS: Fracture of the anterolateral aspect right sixth and seventh ribs, not evident on the prior study. Right pleural effusion with adjacent consolidation/ atelectasis in the right lower lung. No pneumothorax. Left lung clear. Heart size upper limits normal for technique. Atheromatous aortic arch. IMPRESSION: 1. Right rib fractures without pneumothorax. 2. Right pleural effusion with lower lung consolidation/atelectasis. Electronically Signed   By: Corlis Leak M.D.   On: 08/29/2015 12:40   Dg Hips Bilat With Pelvis 2v  08/29/2015  CLINICAL DATA:  82 year old female with fall and bilateral hip pain. EXAM: DG HIP (WITH OR WITHOUT PELVIS) 2V BILAT COMPARISON:  CT of the right lower extremity dated 03/10/2015 go and multiple radiograph dated back to 08/06/2014 FINDINGS: No acute fracture identified. There is a total right hip arthroplasty. No dislocation. There is old fracture of the greater trochanter of the right femur as well as old fracture of the proximal right femur. There is also old fracture of the greater trochanter of the left femur. The bones are osteopenic. There is moderate stool throughout the colon. The soft tissues are otherwise grossly unremarkable. IMPRESSION: No acute fracture or dislocation. Total right hip arthroplasty. Electronically Signed   By: Elgie Collard M.D.   On: 08/29/2015 10:56     CBC  Recent Labs Lab 08/29/15 1001 08/30/15 0607  WBC 20.4* 21.6*  HGB  10.1* 10.0*  HCT 31.7* 30.5*  PLT 525* 479*  MCV 90.3 91.0  MCH 28.8 29.9  MCHC 31.9 32.8  RDW 13.8 13.8  LYMPHSABS 0.7 1.0  MONOABS 1.2* 1.2*  EOSABS 0.0 0.0  BASOSABS 0.0 0.0    Chemistries   Recent Labs Lab 08/29/15 1001 08/30/15 0607  NA 133* 139  K 3.6 3.4*  CL 98* 105  CO2 24 25  GLUCOSE 154* 179*  BUN 23* 22*  CREATININE 0.70 0.69  CALCIUM 8.9 8.7*   ------------------------------------------------------------------------------------------------------------------ estimated creatinine clearance is 30 mL/min (by C-G formula based on Cr of 0.69). ------------------------------------------------------------------------------------------------------------------ No results for input(s): HGBA1C in the last 72 hours. ------------------------------------------------------------------------------------------------------------------ No results for input(s): CHOL, HDL, LDLCALC, TRIG, CHOLHDL, LDLDIRECT in the last 72 hours. ------------------------------------------------------------------------------------------------------------------  Recent Labs  08/30/15 0607  TSH 0.616   ------------------------------------------------------------------------------------------------------------------ No results for input(s): VITAMINB12, FOLATE, FERRITIN, TIBC, IRON, RETICCTPCT in the last 72 hours.  Coagulation profile No results for input(s): INR, PROTIME in the last 168 hours.  No results for input(s): DDIMER in the last 72 hours.  Cardiac Enzymes No results for input(s): CKMB, TROPONINI, MYOGLOBIN in the last 168 hours.  Invalid input(s): CK ------------------------------------------------------------------------------------------------------------------ Invalid input(s): POCBNP     Time Spent in minutes   30 minutes   Jerrion Tabbert M.D on 08/30/2015 at 5:19 PM  Between 7am to 7pm - Pager - [phone]  After 7pm go to [email or web address] - password [facility]  [facility]   Office  [phone]

## 2015-08-30 NOTE — Care Management Note (Signed)
Case Management Note  Patient Details  Name: KRISTYN OBYRNE MRN: [id number] Date of Birth: 06/24/1927  Subjective/Objective:         uti and fall with head laceration            Action/Plan:Date: August 30, 2015 Chart reviewed for concurrent status and case management needs. Will continue to follow patient for changes and needs: Marcelle Smiling, BSN, RN, [place]   [phone]   Expected Discharge Date:   (UNKNOWN)               Expected Discharge Plan:  Assisted Living / Rest Home  In-House Referral:  Clinical Social Work  Discharge planning Services  CM Consult  Post Acute Care Choice:  NA Choice offered to:  NA  DME Arranged:    DME Agency:     HH Arranged:    HH Agency:     Status of Service:  Completed, signed off  Medicare Important Message Given:    Date Medicare IM Given:    Medicare IM give by:    Date Additional Medicare IM Given:    Additional Medicare Important Message give by:     If discussed at Long Length of Stay Meetings, dates discussed:    Additional Comments:  Golda Acre, RN 08/30/2015, 10:39 AM

## 2015-08-30 NOTE — Evaluation (Signed)
Clinical/Bedside Swallow Evaluation Patient Details  Name: Dawn Kaufman MRN: [id number] Date of Birth: Mar 15, 1927  Today's Date: 08/30/2015 Time: SLP Start Time (ACUTE ONLY): 1135 SLP Stop Time (ACUTE ONLY): 1217 SLP Time Calculation (min) (ACUTE ONLY): 42 min  Past Medical History:  Past Medical History  Diagnosis Date  . Hypertension   . Depression   . Osteoporosis   . Dementia    Past Surgical History:  Past Surgical History  Procedure Laterality Date  . Cataract extraction both eyes    . Fracture surgery     HPI:  82 yo female adm to [facility] with AMS, recent fall and decreased intake. Pt found to have rib fx - uncertain if new = and pleural effusion.  Swallow evaluation ordered.  PMH + for HTN, DM,  fall, femur fx, constipation, GERD.     Assessment / Plan / Recommendation Clinical Impression  Pt reports dysphagia x1 month with occasional "strangling" on food requiring liquids to clear.  No overt indication of aspiration with po observed.  Pt does consume po slowly but tends to extend head upward, advised her to head neutral or chin down position for maximal airway protection.  Multiple swallows noted occasionally during po with pt clearing her throat.  At one point, pt laid her hand on her proximal esophagus stating "It feel like sometimes it sticks there".    SLP suspects chronic low grade dysphagia for which she manages.  Using teach back, provided mitigation strategies.  Will follow briefly to assure tolerance.     Aspiration Risk  Mild aspiration risk    Diet Recommendation Regular;Thin liquid   Liquid Administration via: Cup;Straw Medication Administration: Whole meds with liquid (large pills with applesauce) Compensations: Minimize environmental distractions;Slow rate;Small sips/bites (consume liquids t/o meal, rest if short of breath or coughing, head neutral position) Postural Changes: Remain upright for at least 30 minutes after po intake;Seated upright at 90  degrees    Other  Recommendations Oral Care Recommendations: Oral care BID   Follow up Recommendations    tbd   Frequency and Duration min 2x/week  1 week       Prognosis Prognosis for Safe Diet Advancement: Guarded Barriers to Reach Goals: Severity of deficits;Cognitive deficits      Swallow Study   General Date of Onset: 08/30/15 HPI: 82 yo female adm to [facility] with AMS, recent fall and decreased intake. Pt found to have rib fx - uncertain if new = and pleural effusion.  Swallow evaluation ordered.  PMH + for HTN, DM,  fall, femur fx, constipation, GERD.   Type of Study: Bedside Swallow Evaluation Diet Prior to this Study: Regular;Thin liquids Temperature Spikes Noted: No Respiratory Status: Room air History of Recent Intubation: No Behavior/Cognition: Alert;Cooperative;Pleasant mood;Confused Oral Cavity Assessment: Dry Oral Care Completed by SLP: No Oral Cavity - Dentition: Adequate natural dentition (pt reports she is missing her partial) Vision: Functional for self-feeding Self-Feeding Abilities: Able to feed self Patient Positioning: Upright in bed Baseline Vocal Quality: Low vocal intensity Volitional Cough: Strong Volitional Swallow: Able to elicit (with effort)    Oral/Motor/Sensory Function Overall Oral Motor/Sensory Function: Generalized oral weakness   Ice Chips Ice chips: Not tested   Thin Liquid Thin Liquid: Impaired Presentation: Self Fed;Cup;Straw Pharyngeal  Phase Impairments: Multiple swallows;Throat Clearing - Delayed    Nectar Thick Nectar Thick Liquid: Not tested   Honey Thick Honey Thick Liquid: Not tested   Puree Puree: Impaired Presentation: Self Fed;Spoon Pharyngeal Phase Impairments: Suspected delayed Swallow;Multiple swallows  Solid   GO   Solid: Impaired Presentation: Self Fed Oral Phase Impairments: Reduced lingual movement/coordination Oral Phase Functional Implications: Prolonged oral transit;Impaired mastication Pharyngeal Phase  Impairments: Suspected delayed Swallow;Multiple swallows        Donavan Burnet, MS [facility] SLP [phone]

## 2015-08-31 DIAGNOSIS — W19XXXA Unspecified fall, initial encounter: Secondary | ICD-10-CM

## 2015-08-31 DIAGNOSIS — S2241XA Multiple fractures of ribs, right side, initial encounter for closed fracture: Secondary | ICD-10-CM

## 2015-08-31 DIAGNOSIS — S0181XA Laceration without foreign body of other part of head, initial encounter: Secondary | ICD-10-CM

## 2015-08-31 LAB — CBC
HCT: 29 % — ABNORMAL LOW (ref 36.0–46.0)
Hemoglobin: 9.3 g/dL — ABNORMAL LOW (ref 12.0–15.0)
MCH: 29.1 pg (ref 26.0–34.0)
MCHC: 32.1 g/dL (ref 30.0–36.0)
MCV: 90.6 fL (ref 78.0–100.0)
PLATELETS: 455 10*3/uL — AB (ref 150–400)
RBC: 3.2 MIL/uL — AB (ref 3.87–5.11)
RDW: 13.9 % (ref 11.5–15.5)
WBC: 17.6 10*3/uL — ABNORMAL HIGH (ref 4.0–10.5)

## 2015-08-31 LAB — GLUCOSE, CAPILLARY
GLUCOSE-CAPILLARY: 109 mg/dL — AB (ref 65–99)
GLUCOSE-CAPILLARY: 202 mg/dL — AB (ref 65–99)
Glucose-Capillary: 162 mg/dL — ABNORMAL HIGH (ref 65–99)
Glucose-Capillary: 114 mg/dL — ABNORMAL HIGH (ref 65–99)
Glucose-Capillary: 190 mg/dL — ABNORMAL HIGH (ref 65–99)

## 2015-08-31 LAB — BASIC METABOLIC PANEL
Anion gap: 8 (ref 5–15)
BUN: 19 mg/dL (ref 6–20)
CO2: 24 mmol/L (ref 22–32)
CREATININE: 0.64 mg/dL (ref 0.44–1.00)
Calcium: 8.2 mg/dL — ABNORMAL LOW (ref 8.9–10.3)
Chloride: 104 mmol/L (ref 101–111)
Glucose, Bld: 170 mg/dL — ABNORMAL HIGH (ref 65–99)
POTASSIUM: 3.3 mmol/L — AB (ref 3.5–5.1)
SODIUM: 136 mmol/L (ref 135–145)

## 2015-08-31 LAB — URINE CULTURE: Culture: >=100000

## 2015-08-31 MED ORDER — MORPHINE SULFATE (PF) 2 MG/ML IV SOLN
0.5000 mg | Freq: Once | INTRAVENOUS | Status: AC
  Administered 2015-08-31: 0.5 mg via INTRAVENOUS
  Filled 2015-08-31: qty 1

## 2015-08-31 MED ORDER — LORAZEPAM 0.5 MG PO TABS
0.2500 mg | ORAL_TABLET | Freq: Once | ORAL | Status: AC
  Administered 2015-08-31: 0.25 mg via ORAL

## 2015-08-31 MED ORDER — LORAZEPAM 0.5 MG PO TABS
0.5000 mg | ORAL_TABLET | Freq: Two times a day (BID) | ORAL | Status: DC | PRN
  Administered 2015-09-01 – 2015-09-02 (×2): 0.5 mg via ORAL
  Filled 2015-08-31 (×3): qty 1

## 2015-08-31 MED ORDER — LORAZEPAM 0.5 MG PO TABS
0.2500 mg | ORAL_TABLET | Freq: Once | ORAL | Status: AC
  Administered 2015-08-31: 0.25 mg via ORAL
  Filled 2015-08-31: qty 1

## 2015-08-31 MED ORDER — POTASSIUM CHLORIDE IN NACL 40-0.9 MEQ/L-% IV SOLN
INTRAVENOUS | Status: DC
  Administered 2015-08-31 – 2015-09-02 (×3): 75 mL/h via INTRAVENOUS
  Administered 2015-09-02: 50 mL/h via INTRAVENOUS
  Filled 2015-08-31 (×5): qty 1000

## 2015-08-31 NOTE — Progress Notes (Addendum)
Speech Language Pathology Treatment: Dysphagia  Patient Details Name: Dawn Kaufman MRN: [id number] DOB: [birth year] Today's Date: 08/31/2015 Time: 8295-6213 SLP Time Calculation (min) (ACUTE ONLY): 28 min  Assessment / Plan / Recommendation Clinical Impression  SLP spoke to RN who reports pt with poor tolerance of po this am.  She states pt consumed 1/2 piece of toast and one slice of bacon only - likely stopping intake due to dysphagia- coughing with intake and poor subjective sensation of clearance.   Granddaughter Dawn Kaufman present and interviewed to obtain premorbid status.  She does report pt with progressive decline since recent fall approximately 2 weeks ago and also multiple falls over the last year.  Granddaughter states pt with some confusion since her recent fall.      Regarding swallowing ability, pt would "choke" occasionally prior to recent fall (occured 2 weeks ago) per granddaughter.  Given this information, suspect component of chronic deficits with acute exacerbation and decreased functional reserve.    Observed pt consuming water and soda - multiple swallows followed by throat clearing and cough present despite pt being positioned up in bed with head in neutral position.  Cough was not productive, given pt with right lobe pulmonary involvement, ? Aspiration component.    Pt pointed to proximal esophagus and also lower pharynx to indicate area of decreased clearance - she also tends to wince with swallowing = consistently laying hand flat on her chest to indicate discomfort.  This discomfort appeared worse with carbonation than just water.  Mouth was xerostomic but did not appear with whitish patches?   Advised pt and granddaughter to aspiration and dysphagia mitigation strategies.   As this appears to be worsening of chronic condition, do not anticipate MBS will change outcomes.  Advised pt/granddaughter to chronicity of aspiration risk and impact of dysphagia on nutrition  and possibly pulmonary health.  Pt appeared fatigued during session after only 20 minutes with consumption of only a few boluses of liquids.   Question component of esophageal deficits.    Granddaughter reports she will relay information to pt's HCPOA to help with decision making.     HPI HPI: 82 yo female adm to [facility] with AMS, recent fall and decreased intake. Pt found to have rib fx - uncertain if new = and pleural effusion.  Swallow evaluation ordered.  PMH + for HTN, DM,  fall, femur fx, constipation, GERD.  BSE completed yesterday with recommendations for mitigation strategies.        SLP Plan  Continue with current plan of care     Recommendations  Diet recommendations: Regular;Thin liquid (if desire po with known risks) Liquids provided via: Cup;Straw Medication Administration: Whole meds with puree Supervision: Trained caregiver to feed patient Compensations: Slow rate;Small sips/bites;Multiple dry swallows after each bite/sip (drink water t/o meal) Postural Changes and/or Swallow Maneuvers: Seated upright 90 degrees;Upright 30-60 min after meal             Oral Care Recommendations: Oral care BID Follow up Recommendations: None Plan: Continue with current plan of care     GO               Donavan Burnet, MS [facility] SLP [phone]   Relayed information to Md via text page after session today re: aspiration concerns.

## 2015-08-31 NOTE — Evaluation (Signed)
Physical Therapy Evaluation Patient Details Name: Dawn Kaufman MRN: [id number] DOB: [birth year] Today's Date: 08/31/2015   History of Present Illness  82 year old female with history of baseline dementia, hypertension, diabetes, depression, right hip arthroplasty resents with altered mental status and recent multiple falls, most likely due to infectious process including pneumonia and UTI.  Pt also with Closed fracture of seven ribs of right side  Clinical Impression  Pt admitted with above diagnosis. Pt currently with functional limitations due to the deficits listed below (see PT Problem List).  Pt will benefit from skilled PT to increase their independence and safety with mobility to allow discharge to the venue listed below.  Pt from ALF with multiple falls lately and sustained rib fractures.  Family reports pt has been using w/c recently for mobility.  If ALF unable to provide current level of care, pt would benefit from SNF.     Follow Up Recommendations SNF;Supervision/Assistance - 24 hour    Equipment Recommendations  None recommended by PT    Recommendations for Other Services       Precautions / Restrictions Precautions Precautions: Fall Precaution Comments: multiple falls recently      Mobility  Bed Mobility Overal bed mobility: Needs Assistance Bed Mobility: Supine to Sit     Supine to sit: Mod assist;HOB elevated     General bed mobility comments: verbal cues for self assist, pt requiring multimodal cues  Transfers Overall transfer level: Needs assistance Equipment used: 2 person hand held assist Transfers: Sit to/from Stand;Stand Pivot Transfers Sit to Stand: +2 safety/equipment;Min assist Stand pivot transfers: Min assist       General transfer comment: multimodal cues for technique, assist to rise, steady and control descent, takes small shuffling steps over to recliner  Ambulation/Gait                Stairs            Wheelchair  Mobility    Modified Rankin (Stroke Patients Only)       Balance Overall balance assessment: History of Falls;Needs assistance         Standing balance support: Bilateral upper extremity supported;During functional activity Standing balance-Leahy Scale: Poor Standing balance comment: requires UE support and external assist                             Pertinent Vitals/Pain Pain Assessment: Faces Faces Pain Scale: Hurts even more Pain Location: bil flank Pain Descriptors / Indicators: Sore Pain Intervention(s): Monitored during session;Repositioned    Home Living Family/patient expects to be discharged to:: Assisted living               Home Equipment: Wheelchair - manual      Prior Function Level of Independence: Needs assistance   Gait / Transfers Assistance Needed: family reports pt mosly using w/c at facility at this time, increased falls lately, usually has assist for mobility           Hand Dominance        Extremity/Trunk Assessment   Upper Extremity Assessment: Generalized weakness           Lower Extremity Assessment: Generalized weakness         Communication   Communication: No difficulties  Cognition Arousal/Alertness: Awake/alert Behavior During Therapy: Anxious Overall Cognitive Status: History of cognitive impairments - at baseline  General Comments      Exercises        Assessment/Plan    PT Assessment Patient needs continued PT services  PT Diagnosis Difficulty walking;Generalized weakness   PT Problem List Decreased strength;Decreased activity tolerance;Decreased mobility;Decreased balance;Decreased safety awareness;Decreased knowledge of use of DME;Pain  PT Treatment Interventions DME instruction;Gait training;Functional mobility training;Patient/family education;Therapeutic activities;Therapeutic exercise;Balance training   PT Goals (Current goals can be found in the Care  Plan section) Acute Rehab PT Goals PT Goal Formulation: With patient Time For Goal Achievement: 09/07/15 Potential to Achieve Goals: Good    Frequency Min 3X/week   Barriers to discharge        Co-evaluation               End of Session Equipment Utilized During Treatment: Gait belt Activity Tolerance: Patient limited by fatigue;Patient limited by pain Patient left: in chair;with call bell/phone within reach;with chair alarm set;with family/visitor present;with nursing/sitter in room Nurse Communication: Mobility status         Time: 1350-1404 PT Time Calculation (min) (ACUTE ONLY): 14 min   Charges:   PT Evaluation $PT Eval Low Complexity: 1 Procedure     PT G Codes:        Bethzaida Boord,KATHrine E 08/31/2015, 4:21 PM Zenovia Jarred, PT, DPT 08/31/2015 Pager: [phone]

## 2015-08-31 NOTE — Progress Notes (Signed)
Initial Nutrition Assessment  DOCUMENTATION CODES:   Underweight  INTERVENTION:  - Will order Magic Cup BID with meals, each supplement provides 290 kcal and 9 grams of protein - RD will continue to monitor for needs and SLP evaluations  NUTRITION DIAGNOSIS:   Inadequate oral intake related to mouth pain, acute illness as evidenced by per patient/family report.  GOAL:   Patient will meet greater than or equal to 90% of their needs  MONITOR:   PO intake, Supplement acceptance, Weight trends, Labs, Skin, I & O's  REASON FOR ASSESSMENT:   Other (Comment) (Underweight BMI)  ASSESSMENT:   82 y.o. female the history of dementia who resides in an assisted living facility. According to the patient's daughter who is the historian about a week ago, her mobility diminished requiring her to use a wheelchair instead of a walker. She's also had some slowly progressive worsening confusion and a decreased appetite over several days. Today she attempted to stand from her wheelchair became unsteady on her feet and sustained a fall. This resulted in a laceration to the forehead thus she was transfered to the emergency room. In addition to her dementia the patient is also hard of hearing currently does not have her hearing aids available.  Pt seen for underweight BMI. Per chart review, pt ate 60% of breakfast, 75% of lunch, and 50% of dinner yesterday (2/21) and no intakes documented from breakfast today. Pt sleeping at this time with granddaughter at bedside. She prefers to talk close to the door as to not arouse pt; unable to do physical assessment at this time per family preference. The granddaughter provides all information during visit.  Pt has never been a big eater and family reports pt "eats like a bird" but that in the past few weeks intakes have further declined. Family also states that pt has had progressive worsening of difficulty swallowing. She states that pt holds ribcage with all  swallowing attempts but does not complain of abdominal pain or nausea. SLP saw pt 2/21 and recommended current diet order and for meats to be ground with extra gravy.  Granddaughter states pt has been losing weight over the past few months but she is unsure of UBW or amount of weight lost. Per chart review, pt has lost 17 lbs (17% body weight) in the past 5 months which is significant for time frame. Pt likely meets criteria for severe malnutrition but unable to confirm at this time without physical assessment.   MD notes from today and yesterday indicate plan to consult RD and order Ensure but no orders for either of these items in place up to this time; will order Magic Cup as it may be easier for pt to tolerate given rib cage holding with swallowing. Unsure if pt is meeting needs at this time. Medications reviewed. Labs reviewed; CBGs:97-255 mg/dL, K: 3.3 mmol/L, Ca: 8.2 mg/dL.   Diet Order:  Diet regular Room service appropriate?: Yes with Assist; Fluid consistency:: Thin  Skin:  Reviewed, no issues  Last BM:  2/22  Height:   Ht Readings from Last 1 Encounters:  08/29/15  (1.626 m)    Weight:   Wt Readings from Last 1 Encounters:  08/29/15 82 lb 14.3 oz (37.6 kg)    Ideal Body Weight:  54.54 kg (kg)  BMI:  Body mass index is 14.22 kg/(m^2).  Estimated Nutritional Needs:   Kcal:  1000-1200  Protein:  40-50 grams  Fluid:  1.5 L/day  EDUCATION NEEDS:  No education needs identified at this time     Jarome Matin, [place], [facility] Inpatient Clinical Dietitian Pager # [phone] After hours/weekend pager # [phone]

## 2015-08-31 NOTE — Progress Notes (Signed)
PT Cancellation Note  Patient Details Name: Dawn Kaufman MRN: [id number] DOB: July 28, 1926   Cancelled Treatment:    Reason Eval/Treat Not Completed: Patient at procedure or test/unavailable (SLP)   Maida Sale E 08/31/2015, 11:20 AM Zenovia Jarred, PT, DPT 08/31/2015 Pager: [phone]

## 2015-08-31 NOTE — Progress Notes (Addendum)
Patient Demographics  Dawn Kaufman, is a 82 y.o. female, DOB - 1926/08/15, WGN:[id number]  Admit date - 08/29/2015   Admitting Physician Altha Harm, MD  Outpatient Primary MD for the patient is [facility], MD  LOS - 2   Chief Complaint  Patient presents with  . Fall  . Head Laceration       Admission HPI/Brief narrative: 82 year old female with history of baseline dementia, hypertension, diabetes, depression, right hip arthroplasty resents with altered mental status and recent multiple falls, most likely due to infectious process including pneumonia and UTI.  Subjective:   Dawn Kaufman today has, No headache, No chest pain, No abdominal pain , No Cough or shortness of breath.  Assessment & Plan    Active Problems:   Closed fracture of seven ribs of right side   Head injury   Diabetes mellitus, type 2 (HCC)   UTI (lower urinary tract infection)   Healthcare-associated pneumonia   Fall   Forehead laceration  Acute encephalopathy - Patient with baseline dementia, mental status complicated by metabolic encephalopathy secondary to infectious process from UTI and pneumonia. - CT head with no acute findings  UTI - Urine culture growing Escherichia coli, continue with IV Azactam, for now in the setting of HCAP  Pneumonia - Patient was noticed with cough while eating, chest x-ray with evidence of right pleural effusion/consolidation, suspicious for aspiration. - Continue with IV vancomycin (MRSA PCR +) and Azactam - Mild risk for aspiration by SLP Narrow antibiotic coverage to Levaquin tomorrow Anticipate discharge on Friday   Diabetes mellitus - CBG uncontrolled, continue with insulin sliding scale, continue to hold metformin   Generalized weakness and deconditioning - Recent multiple falls, with evidence of rib fracture on x-ray(chronicity unclear giving multiple falls),  and head trauma on presentation. - Continue with incentive spirometry PT OT consultation  protein calorie malnutrition - will consult nutrition, will start on ensure  Code Status: DNR  Family Communication: discussed with Dawn Kaufman Dawn Kaufman Daughter   [phone]     Disposition Plan: Pending PT consult, she is at ALF, possible dc on Friday   Procedures  none   Consults   none   Medications  Scheduled Meds: . aspirin EC  81 mg Oral Daily  . aztreonam  1 g Intravenous 3 times per day  . bacitracin   Topical BID  . chlorhexidine  15 mL Mouth/Throat BID  . Chlorhexidine Gluconate Cloth  6 each Topical Q0600  . insulin aspart  0-9 Units Subcutaneous TID WC  . mupirocin ointment   Nasal BID  . pantoprazole  40 mg Oral Daily  . vancomycin  500 mg Intravenous Q24H  . venlafaxine XR  150 mg Oral Q breakfast   Continuous Infusions: . sodium chloride 50 mL/hr at 08/30/15 2154   PRN Meds:.acetaminophen **OR** acetaminophen, LORazepam  DVT Prophylaxis  Heparin - SCDs  Lab Results  Component Value Date   PLT 455* 08/31/2015    Antibiotics    Anti-infectives    Start     Dose/Rate Route Frequency Ordered Stop   08/30/15 1600  vancomycin (VANCOCIN) 500 mg in sodium chloride 0.9 % 100 mL IVPB  Status:  Discontinued     500 mg 100 mL/hr over 60  Minutes Intravenous Every 24 hours 08/29/15 1555 08/29/15 1632   08/29/15 2200  aztreonam (AZACTAM) 1 g in dextrose 5 % 50 mL IVPB     1 g 100 mL/hr over 30 Minutes Intravenous 3 times per day 08/29/15 1555 09/06/15 1359   08/29/15 1700  vancomycin (VANCOCIN) 500 mg in sodium chloride 0.9 % 100 mL IVPB     500 mg 100 mL/hr over 60 Minutes Intravenous Every 24 hours 08/29/15 1632     08/29/15 1600  vancomycin (VANCOCIN) IVPB 1000 mg/200 mL premix  Status:  Discontinued     1,000 mg 200 mL/hr over 60 Minutes Intravenous NOW 08/29/15 1555 08/29/15 1632   08/29/15 1530  aztreonam (AZACTAM) 2 g in dextrose 5 % 50 mL IVPB   Status:  Discontinued     2 g 100 mL/hr over 30 Minutes Intravenous 3 times per day 08/29/15 1529 08/29/15 1555   08/29/15 1145  aztreonam (AZACTAM) 1 g in dextrose 5 % 50 mL IVPB     1 g 100 mL/hr over 30 Minutes Intravenous  Once 08/29/15 1138 08/29/15 1401          Objective:   Filed Vitals:   08/29/15 1624 08/30/15 0537 08/30/15 2127 08/31/15 0518  BP:  140/60 132/49 152/62  Pulse:  89 80 89  Temp:  98 F (36.7 C) 97.8 F (36.6 C) 98.8 F (37.1 C)  TempSrc:  Oral Oral Oral  Resp:  17 18 18   Height: 5\' 4"  (1.626 m)     Weight: 37.6 kg (82 lb 14.3 oz)     SpO2:  93% 94% 92%    Wt Readings from Last 3 Encounters:  08/29/15 37.6 kg (82 lb 14.3 oz)  03/24/15 44.906 kg (99 lb)  03/15/15 44.906 kg (99 lb)     Intake/Output Summary (Last 24 hours) at 08/31/15 1050 Last data filed at 08/31/15 1007  Gross per 24 hour  Intake   2375 ml  Output    301 ml  Net   2074 ml     Physical Exam  Awake Alert, Oriented oriented to self and place, frail, cachectic Supple Neck,No JVD,  Symmetrical Chest wall movement, Good air movement bilaterall RRR,No Gallops,Rubs or new Murmurs, No Parasternal Heave +ve B.Sounds, Abd Soft, No tenderness,  No Cyanosis, Clubbing or edema, No new Rash.   Data Review   Micro Results Recent Results (from the past 240 hour(s))  Urine culture     Status: None   Collection Time: 08/29/15 10:50 AM  Result Value Ref Range Status   Specimen Description URINE, CATHETERIZED  Final   Special Requests NONE  Final   Culture   Final    >=100,000 COLONIES/mL ESCHERICHIA COLI Performed at [facility]    Report Status 08/31/2015 FINAL  Final   Organism ID, Bacteria ESCHERICHIA COLI  Final      Susceptibility   Escherichia coli - MIC*    AMPICILLIN <=2 SENSITIVE Sensitive     CEFAZOLIN <=4 SENSITIVE Sensitive     CEFTRIAXONE <=1 SENSITIVE Sensitive     CIPROFLOXACIN <=0.25 SENSITIVE Sensitive     GENTAMICIN <=1 SENSITIVE Sensitive      IMIPENEM <=0.25 SENSITIVE Sensitive     NITROFURANTOIN <=16 SENSITIVE Sensitive     TRIMETH/SULFA <=20 SENSITIVE Sensitive     AMPICILLIN/SULBACTAM <=2 SENSITIVE Sensitive     PIP/TAZO <=4 SENSITIVE Sensitive     * >=100,000 COLONIES/mL ESCHERICHIA COLI  Culture, blood (routine x 2) Call MD if unable  to obtain prior to antibiotics being given     Status: None (Preliminary result)   Collection Time: 08/29/15  4:23 PM  Result Value Ref Range Status   Specimen Description BLOOD LEFT ARM  Final   Special Requests IN PEDIATRIC BOTTLE  4CC  Final   Culture   Final    NO GROWTH < 24 HOURS Performed at [facility]    Report Status PENDING  Incomplete  Culture, blood (routine x 2) Call MD if unable to obtain prior to antibiotics being given     Status: None (Preliminary result)   Collection Time: 08/29/15  5:58 PM  Result Value Ref Range Status   Specimen Description BLOOD LEFT ARM  Final   Special Requests BOTTLES DRAWN AEROBIC AND ANAEROBIC  10CC  Final   Culture   Final    NO GROWTH < 24 HOURS Performed at [facility]    Report Status PENDING  Incomplete  MRSA PCR Screening     Status: Abnormal   Collection Time: 08/29/15  6:30 PM  Result Value Ref Range Status   MRSA by PCR POSITIVE (A) NEGATIVE Final    Comment:        The GeneXpert MRSA Assay (FDA approved for NASAL specimens only), is one component of a comprehensive MRSA colonization surveillance program. It is not intended to diagnose MRSA infection nor to guide or monitor treatment for MRSA infections. RESULT CALLED TO, READ BACK BY AND VERIFIED WITH: Alvia Grove [id number] @ 2021 BY J SCOTTON     Radiology Reports Ct Head Wo Contrast  08/29/2015  CLINICAL DATA:  Right forehead laceration following a fall onto the floor this morning. Altered mental status. EXAM: CT HEAD WITHOUT CONTRAST CT CERVICAL SPINE WITHOUT CONTRAST TECHNIQUE: Multidetector CT imaging of the head and cervical spine was performed  following the standard protocol without intravenous contrast. Multiplanar CT image reconstructions of the cervical spine were also generated. COMPARISON:  Head CT dated 08/07/2014. FINDINGS: CT HEAD FINDINGS Diffusely enlarged ventricles and subarachnoid spaces. Patchy white matter low density in both cerebral hemispheres. Stable small amount of calcification within an area of low density in the left frontal white matter. No skull fracture, intracranial hemorrhage or paranasal sinus air-fluid levels. CT CERVICAL SPINE FINDINGS Mild levoconvex scoliosis. Multilevel degenerative changes. Mild partially calcified pannus around the dens. No prevertebral soft tissue swelling, fractures or subluxations. Bullous changes in both upper lobes with biapical pleural and parenchymal scarring, greater on the right. Bilateral carotid artery calcifications. IMPRESSION: 1. No skull fracture or intracranial hemorrhage. 2. No cervical spine fracture or subluxation. 3. Stable diffuse cerebral and cerebellar atrophy. 4. Mildly progressive chronic small vessel white matter ischemic changes in both cerebral hemispheres. 5. Stable left frontal lobe dystrophic calcification. 6. Bilateral carotid artery atheromatous calcifications. 7. COPD. Electronically Signed   By: Beckie Salts M.D.   On: 08/29/2015 10:40   Ct Cervical Spine Wo Contrast  08/29/2015  CLINICAL DATA:  Right forehead laceration following a fall onto the floor this morning. Altered mental status. EXAM: CT HEAD WITHOUT CONTRAST CT CERVICAL SPINE WITHOUT CONTRAST TECHNIQUE: Multidetector CT imaging of the head and cervical spine was performed following the standard protocol without intravenous contrast. Multiplanar CT image reconstructions of the cervical spine were also generated. COMPARISON:  Head CT dated 08/07/2014. FINDINGS: CT HEAD FINDINGS Diffusely enlarged ventricles and subarachnoid spaces. Patchy white matter low density in both cerebral hemispheres. Stable small  amount of calcification within an area of low density in  the left frontal white matter. No skull fracture, intracranial hemorrhage or paranasal sinus air-fluid levels. CT CERVICAL SPINE FINDINGS Mild levoconvex scoliosis. Multilevel degenerative changes. Mild partially calcified pannus around the dens. No prevertebral soft tissue swelling, fractures or subluxations. Bullous changes in both upper lobes with biapical pleural and parenchymal scarring, greater on the right. Bilateral carotid artery calcifications. IMPRESSION: 1. No skull fracture or intracranial hemorrhage. 2. No cervical spine fracture or subluxation. 3. Stable diffuse cerebral and cerebellar atrophy. 4. Mildly progressive chronic small vessel white matter ischemic changes in both cerebral hemispheres. 5. Stable left frontal lobe dystrophic calcification. 6. Bilateral carotid artery atheromatous calcifications. 7. COPD. Electronically Signed   By: Beckie Salts M.D.   On: 08/29/2015 10:40   Dg Chest Portable 1 View  08/29/2015  CLINICAL DATA:  Unwitnessed fall today; no chest complaints per pt; per Staff at nursing home: Staff stated increased confusion for the past week and patient had a strong urine odor.; HTN; ex smoker EXAM: PORTABLE CHEST - 1 VIEW COMPARISON:  03/09/2015 FINDINGS: Fracture of the anterolateral aspect right sixth and seventh ribs, not evident on the prior study. Right pleural effusion with adjacent consolidation/ atelectasis in the right lower lung. No pneumothorax. Left lung clear. Heart size upper limits normal for technique. Atheromatous aortic arch. IMPRESSION: 1. Right rib fractures without pneumothorax. 2. Right pleural effusion with lower lung consolidation/atelectasis. Electronically Signed   By: Corlis Leak M.D.   On: 08/29/2015 12:40   Dg Hips Bilat With Pelvis 2v  08/29/2015  CLINICAL DATA:  82 year old female with fall and bilateral hip pain. EXAM: DG HIP (WITH OR WITHOUT PELVIS) 2V BILAT COMPARISON:  CT of the  right lower extremity dated 03/10/2015 go and multiple radiograph dated back to 08/06/2014 FINDINGS: No acute fracture identified. There is a total right hip arthroplasty. No dislocation. There is old fracture of the greater trochanter of the right femur as well as old fracture of the proximal right femur. There is also old fracture of the greater trochanter of the left femur. The bones are osteopenic. There is moderate stool throughout the colon. The soft tissues are otherwise grossly unremarkable. IMPRESSION: No acute fracture or dislocation. Total right hip arthroplasty. Electronically Signed   By: Elgie Collard M.D.   On: 08/29/2015 10:56     CBC  Recent Labs Lab 08/29/15 1001 08/30/15 0607 08/31/15 0547  WBC 20.4* 21.6* 17.6*  HGB 10.1* 10.0* 9.3*  HCT 31.7* 30.5* 29.0*  PLT 525* 479* 455*  MCV 90.3 91.0 90.6  MCH 28.8 29.9 29.1  MCHC 31.9 32.8 32.1  RDW 13.8 13.8 13.9  LYMPHSABS 0.7 1.0  --   MONOABS 1.2* 1.2*  --   EOSABS 0.0 0.0  --   BASOSABS 0.0 0.0  --     Chemistries   Recent Labs Lab 08/29/15 1001 08/30/15 0607 08/31/15 0547  NA 133* 139 136  K 3.6 3.4* 3.3*  CL 98* 105 104  CO2 24 25 24   GLUCOSE 154* 179* 170*  BUN 23* 22* 19  CREATININE 0.70 0.69 0.64  CALCIUM 8.9 8.7* 8.2*   ------------------------------------------------------------------------------------------------------------------ estimated creatinine clearance is 30 mL/min (by C-G formula based on Cr of 0.64). ------------------------------------------------------------------------------------------------------------------ No results for input(s): HGBA1C in the last 72 hours. ------------------------------------------------------------------------------------------------------------------ No results for input(s): CHOL, HDL, LDLCALC, TRIG, CHOLHDL, LDLDIRECT in the last 72  hours. ------------------------------------------------------------------------------------------------------------------  Recent Labs  08/30/15 0607  TSH 0.616   ------------------------------------------------------------------------------------------------------------------ No results for input(s): VITAMINB12, FOLATE, FERRITIN, TIBC, IRON, RETICCTPCT in the  last 72 hours.  Coagulation profile No results for input(s): INR, PROTIME in the last 168 hours.  No results for input(s): DDIMER in the last 72 hours.  Cardiac Enzymes No results for input(s): CKMB, TROPONINI, MYOGLOBIN in the last 168 hours.  Invalid input(s): CK ------------------------------------------------------------------------------------------------------------------ Invalid input(s): POCBNP     Time Spent in minutes   30 minutes   Micheale Schlack M.D on 08/31/2015 at 10:50 AM  Between 7am to 7pm - Pager - [phone]  After 7pm go to [email or web address] - password [facility]  [facility]   Office  [phone]

## 2015-09-01 ENCOUNTER — Encounter (HOSPITAL_COMMUNITY): Payer: Self-pay | Admitting: Student

## 2015-09-01 DIAGNOSIS — S0990XD Unspecified injury of head, subsequent encounter: Secondary | ICD-10-CM

## 2015-09-01 DIAGNOSIS — E118 Type 2 diabetes mellitus with unspecified complications: Secondary | ICD-10-CM

## 2015-09-01 DIAGNOSIS — G9340 Encephalopathy, unspecified: Secondary | ICD-10-CM | POA: Diagnosis present

## 2015-09-01 DIAGNOSIS — S2241XD Multiple fractures of ribs, right side, subsequent encounter for fracture with routine healing: Secondary | ICD-10-CM

## 2015-09-01 DIAGNOSIS — W19XXXD Unspecified fall, subsequent encounter: Secondary | ICD-10-CM

## 2015-09-01 LAB — CBC
HEMATOCRIT: 28.7 % — AB (ref 36.0–46.0)
HEMOGLOBIN: 9.3 g/dL — AB (ref 12.0–15.0)
MCH: 29.2 pg (ref 26.0–34.0)
MCHC: 32.4 g/dL (ref 30.0–36.0)
MCV: 90 fL (ref 78.0–100.0)
Platelets: 465 10*3/uL — ABNORMAL HIGH (ref 150–400)
RBC: 3.19 MIL/uL — ABNORMAL LOW (ref 3.87–5.11)
RDW: 14 % (ref 11.5–15.5)
WBC: 15.4 10*3/uL — AB (ref 4.0–10.5)

## 2015-09-01 LAB — GLUCOSE, CAPILLARY
GLUCOSE-CAPILLARY: 129 mg/dL — AB (ref 65–99)
GLUCOSE-CAPILLARY: 117 mg/dL — AB (ref 65–99)
GLUCOSE-CAPILLARY: 91 mg/dL (ref 65–99)
Glucose-Capillary: 218 mg/dL — ABNORMAL HIGH (ref 65–99)

## 2015-09-01 LAB — COMPREHENSIVE METABOLIC PANEL
ALBUMIN: 2.1 g/dL — AB (ref 3.5–5.0)
ALK PHOS: 110 U/L (ref 38–126)
ALT: 16 U/L (ref 14–54)
AST: 16 U/L (ref 15–41)
Anion gap: 9 (ref 5–15)
BILIRUBIN TOTAL: 0.4 mg/dL (ref 0.3–1.2)
BUN: 15 mg/dL (ref 6–20)
CO2: 25 mmol/L (ref 22–32)
Calcium: 8.5 mg/dL — ABNORMAL LOW (ref 8.9–10.3)
Chloride: 105 mmol/L (ref 101–111)
Creatinine, Ser: 0.54 mg/dL (ref 0.44–1.00)
GFR calc Af Amer: >60 mL/min (ref >60)
GFR calc non Af Amer: >60 mL/min (ref >60)
GLUCOSE: 157 mg/dL — AB (ref 65–99)
POTASSIUM: 3.7 mmol/L (ref 3.5–5.1)
Sodium: 139 mmol/L (ref 135–145)
TOTAL PROTEIN: 5.3 g/dL — AB (ref 6.5–8.1)

## 2015-09-01 MED ORDER — OXYCODONE HCL 5 MG PO TABS
5.0000 mg | ORAL_TABLET | Freq: Four times a day (QID) | ORAL | Status: DC | PRN
  Administered 2015-09-01: 5 mg via ORAL
  Filled 2015-09-01: qty 1

## 2015-09-01 MED ORDER — ENOXAPARIN SODIUM 30 MG/0.3ML ~~LOC~~ SOLN
30.0000 mg | SUBCUTANEOUS | Status: DC
  Administered 2015-09-01: 30 mg via SUBCUTANEOUS
  Filled 2015-09-01 (×3): qty 0.3

## 2015-09-01 NOTE — Care Management Important Message (Signed)
Important Message  Patient Details IM Letter given to Cookie/Case Manager to present to Patient Name: MARCEIL WELP MRN: [id number] Date of Birth: 1926/09/23   Medicare Important Message Given:  Yes    Haskell Flirt 09/01/2015, 2:59 PMImportant Message  Patient Details  Name: DELPHINE SIZEMORE MRN: [id number] Date of Birth: March 01, 1927   Medicare Important Message Given:  Yes    Haskell Flirt 09/01/2015, 2:59 PM

## 2015-09-01 NOTE — Progress Notes (Addendum)
Pharmacy Antibiotic Note  58 yoF with hx dementia from ALF with worsening confusion and fall.  U/A +nitrites, few bacteria.  CXR shows right rib fractures without pneumothorax and an area of atelectasis vs consolidation with pleural effusion.  Pharmacy consulted to start Vancomycin for possible PNA and UTI. MD dosing aztreonam due to PCN allergies (rxn unknown).    - Urine culture now with Ecoli;  blood cultures have been negative thus far. - afeb, wbc elevated but trending down, scr stable (crcl~30)  Plan: - continue Vancomycin  IV q24h.   Will hold on off checking level for now since MD's note on 2/22 indicated plan for possible de-escalation today. - Aztreonam 1g IV q8h   Height:  (162.6 cm) Weight: 82 lb 14.3 oz (37.6 kg) IBW/kg (Calculated) : 54.7 Temp (24hrs), Avg:98 F (36.7 C), Min:97.6 F (36.4 C), Max:98.3 F (36.8 C)   Recent Labs Lab 08/29/15 1001 08/30/15 0607 08/31/15 0547 09/01/15 0522  WBC 20.4* 21.6* 17.6* 15.4*  CREATININE 0.70 0.69 0.64 0.54    Estimated Creatinine Clearance: 30 mL/min (by C-G formula based on Cr of 0.54).    Allergies  Allergen Reactions  . Penicillins Swelling    Has patient had a PCN reaction causing immediate rash, facial/tongue/throat swelling, SOB or lightheadedness with hypotension: unknown Has patient had a PCN reaction causing severe rash involving mucus membranes or skin necrosis: unknown Has patient had a PCN reaction that required hospitalization: unknown Has patient had a PCN reaction occurring within the last 10 years: unknown If all of the above answers are "NO", then may proceed with Cephalosporin use.   . Latex Hives  . Shellfish Allergy     Develops gout with frequent consumption of shellfish    Antimicrobials this admission: 2/20 >> Vancomycin >> 2/20 >> Aztreonam >>   Dose adjustments this admission: N/A  Microbiology results: 2/20 BCx x2: NGTD 2/20 UCx: >100K EColi pansensitive FINAL 2/20 Strep  pneumo urinary antigen: neg 2/20 MRSA positive   Thank you for allowing pharmacy to be a part of this patient's care.  Dorna Leitz, PharmD, BCPS 09/01/2015 1:14 PM

## 2015-09-01 NOTE — Progress Notes (Signed)
Pt admitted from [facility].   Pt recommending SNF and [facility] agrees pt needs short term rehab.  Pt daughter agreeable to SNF. Pt daughter prefers [facility].  CSW sent clinical information to [facility].   Full psychosocial assessment to follow.  Loletta Specter, MSW, LCSW Clinical Social Work 5E coverage [phone]

## 2015-09-01 NOTE — Progress Notes (Signed)
OT Cancellation Note  Patient Details Name: Dawn Kaufman MRN: [id number] DOB: 01/26/1927   Cancelled Treatment:    Noted plans for SNF- will defer OT eval to SNF  [name], [place] Kung 09/01/2015, 9:24 PM

## 2015-09-01 NOTE — NC FL2 (Signed)
[place] MEDICAID FL2 LEVEL OF CARE SCREENING TOOL     IDENTIFICATION  Patient Name: Dawn Kaufman Birthdate: 30-Oct-1926 Sex: female Admission Date (Current Location): 08/29/2015  [facility] and [place] Number:  Producer, television/film/video and Address:  [facility],  [address], [place] [address]      Provider Number: [id number]  Attending Physician Name and Address:  Maryruth Bun Rama, MD  Relative Name and Phone Number:       Current Level of Care: Hospital Recommended Level of Care: Skilled Nursing Facility Prior Approval Number:    Date Approved/Denied:   PASRR Number:  [id number]   Discharge Plan: SNF    Current Diagnoses: Patient Active Problem List   Diagnosis Date Noted  . Acute encephalopathy 09/01/2015  . Fall   . Forehead laceration   . UTI (lower urinary tract infection) 08/29/2015  . Healthcare-associated pneumonia 08/29/2015  . Closed fracture of seven ribs of right side 03/10/2015  . Head injury 03/10/2015  . Laceration of right middle finger w/o foreign body w/o damage to nail 03/10/2015  . Diabetes mellitus, type 2 (HCC) 03/10/2015  . Mood disorder (HCC) 03/10/2015  . Femur fracture, right (HCC) 03/10/2015  . Protein-calorie malnutrition, severe (HCC) 03/10/2015  . Closed fracture of trochanter of right femur (HCC) 03/09/2015    Orientation RESPIRATION BLADDER Height & Weight     Self  Normal Incontinent Weight: 82 lb 14.3 oz (37.6 kg) Height:   (162.6 cm)  BEHAVIORAL SYMPTOMS/MOOD NEUROLOGICAL BOWEL NUTRITION STATUS   (no behaviors)  (NONE) Continent Diet (Diet Regular)  AMBULATORY STATUS COMMUNICATION OF NEEDS Skin   Extensive Assist Verbally Normal                       Personal Care Assistance Level of Assistance  Bathing, Feeding, Dressing Bathing Assistance: Maximum assistance Feeding assistance: Limited assistance Dressing Assistance: Maximum assistance     Functional Limitations Info  Sight, Hearing,  Speech Sight Info: Adequate Hearing Info: Impaired Speech Info: Adequate    SPECIAL CARE FACTORS FREQUENCY  PT (By licensed PT), OT (By licensed OT)     PT Frequency: 5 x a week OT Frequency: 5 x a week            Contractures Contractures Info: Not present    Additional Factors Info  Code Status, Allergies, Isolation Precautions Code Status Info: DNR code status Allergies Info: Penicillins, Latex, Shellfish Allergy     Isolation Precautions Info: Contact Isolation: 2.20.17 mrsa by pcr     Current Medications (09/01/2015):  This is the current hospital active medication list Current Facility-Administered Medications  Medication Dose Route Frequency Provider Last Rate Last Dose  . 0.9 % NaCl with KCl 40 mEq / L  infusion   Intravenous Continuous Richarda Overlie, MD 75 mL/hr at 09/01/15 1220 75 mL/hr at 09/01/15 1220  . acetaminophen (TYLENOL) tablet 650 mg  650 mg Oral Q6H PRN Altha Harm, MD   650 mg at 09/01/15 1612   Or  . acetaminophen (TYLENOL) suppository 650 mg  650 mg Rectal Q6H PRN Altha Harm, MD      . aspirin EC tablet 81 mg  81 mg Oral Daily Altha Harm, MD   81 mg at 09/01/15 1233  . aztreonam (AZACTAM) 1 g in dextrose 5 % 50 mL IVPB  1 g Intravenous 3 times per day Altha Harm, MD   1 g at 09/01/15 1521  . bacitracin  ointment   Topical BID Altha Harm, MD      . chlorhexidine (PERIDEX) 0.12 % solution 15 mL  15 mL Mouth/Throat BID Altha Harm, MD   15 mL at 09/01/15 1233  . Chlorhexidine Gluconate Cloth 2 % PADS 6 each  6 each Topical Q0600 Leda Gauze, NP   6 each at 09/01/15 [phone]  . enoxaparin (LOVENOX) injection 30 mg  30 mg Subcutaneous Q24H Christina P Rama, MD      . insulin aspart (novoLOG) injection 0-9 Units  0-9 Units Subcutaneous TID WC Altha Harm, MD   1 Units at 09/01/15 [phone]  . LORazepam (ATIVAN) tablet 0.5 mg  0.5 mg Oral Q12H PRN Leda Gauze, NP   0.5 mg at 09/01/15 1612  .  mupirocin ointment (BACTROBAN) 2 %   Nasal BID Beather Arbour Kirby-Graham, NP      . oxyCODONE (Oxy IR/ROXICODONE) immediate release tablet 5 mg  5 mg Oral Q6H PRN Maryruth Bun Rama, MD      . pantoprazole (PROTONIX) EC tablet 40 mg  40 mg Oral Daily Altha Harm, MD   40 mg at 09/01/15 1233  . vancomycin (VANCOCIN) 500 mg in sodium chloride 0.9 % 100 mL IVPB  500 mg Intravenous Q24H Altha Harm, MD   500 mg at 08/31/15 1650  . venlafaxine XR (EFFEXOR-XR) 24 hr capsule 150 mg  150 mg Oral Q breakfast Altha Harm, MD   150 mg at 08/31/15 1019     Discharge Medications: Please see discharge summary for a list of discharge medications.  Relevant Imaging Results:  Relevant Lab Results:   Additional Information SSN: [id number]  Dontaye Hur, Selena Lesser A, LCSW

## 2015-09-01 NOTE — Progress Notes (Signed)
Progress Note   Dawn Kaufman GNF:[id number] DOB: 09-15-1926 DOA: 08/29/2015 PCP: Kirstie Peri, MD   Brief Narrative:   Dawn Kaufman is an 82 y.o. female with a PMH of dementia, hypertension, diabetes, and depression who has had recent falls and was admitted 08/29/15 with altered mental status and head injury/forehead laceration/fractures of the ribs on the right side secondary to fall.  Assessment/Plan:   Principal Problem:   Acute encephalopathy in the setting of microvascular cerebrovascular disease and cerebral atrophy/dementia - Secondary to Escherichia coli UTI and healthcare associated pneumonia. - Treat underlying infections.  Active Problems:   Fall resulting in head injury/forehead laceration and Closed fracture of seven ribs of right side - Seen by physical therapy 08/31/15 with SNF versus 24 hour supervision at home recommended.     MRSA carrier - Continue contact precautions and decontamination therapy.    Diabetes mellitus, type 2 (HCC) - Currently being managed with insulin sensitive SSI. CBGs 109-190.    UTI (lower urinary tract infection) -  Urine cultures positive for Escherichia coli. Aztreonam will cover.     Healthcare-associated aspiration pneumonia/dysphasia - Follow-up blood and sputum cultures.  - Continue vancomycin and aztreonam.  - Evaluated by speech therapist. Felt to be at some risk for aspiration. Aspiration precautions initiated.     DVT Prophylaxis - Lovenox ordered.   Family Communication/Anticipated D/C date and plan/Code Status   Family Communication: 2 granddaughters at bedside. Disposition Plan: From [place], ALF. Will likely need SNF at discharge. Anticipated D/C date:  09/03/15. Code Status:  DO NOT RESUSCITATE.    IV Access:    Peripheral IV   Procedures and diagnostic studies:   Ct Head and cervical spine Wo Contrast  08/29/2015  CLINICAL DATA:  Right forehead laceration following a fall onto the floor this  morning. Altered mental status. EXAM: CT HEAD WITHOUT CONTRAST CT CERVICAL SPINE WITHOUT CONTRAST TECHNIQUE: Multidetector CT imaging of the head and cervical spine was performed following the standard protocol without intravenous contrast. Multiplanar CT image reconstructions of the cervical spine were also generated. COMPARISON:  Head CT dated 08/07/2014. FINDINGS: CT HEAD FINDINGS Diffusely enlarged ventricles and subarachnoid spaces. Patchy white matter low density in both cerebral hemispheres. Stable small amount of calcification within an area of low density in the left frontal white matter. No skull fracture, intracranial hemorrhage or paranasal sinus air-fluid levels. CT CERVICAL SPINE FINDINGS Mild levoconvex scoliosis. Multilevel degenerative changes. Mild partially calcified pannus around the dens. No prevertebral soft tissue swelling, fractures or subluxations. Bullous changes in both upper lobes with biapical pleural and parenchymal scarring, greater on the right. Bilateral carotid artery calcifications. IMPRESSION: 1. No skull fracture or intracranial hemorrhage. 2. No cervical spine fracture or subluxation. 3. Stable diffuse cerebral and cerebellar atrophy. 4. Mildly progressive chronic small vessel white matter ischemic changes in both cerebral hemispheres. 5. Stable left frontal lobe dystrophic calcification. 6. Bilateral carotid artery atheromatous calcifications. 7. COPD. Electronically Signed   By: Beckie Salts M.D.   On: 08/29/2015 10:40    Dg Chest Portable 1 View  08/29/2015  CLINICAL DATA:  Unwitnessed fall today; no chest complaints per pt; per Staff at nursing home: Staff stated increased confusion for the past week and patient had a strong urine odor.; HTN; ex smoker EXAM: PORTABLE CHEST - 1 VIEW COMPARISON:  03/09/2015 FINDINGS: Fracture of the anterolateral aspect right sixth and seventh ribs, not evident on the prior study. Right pleural effusion with adjacent consolidation/  atelectasis in the  right lower lung. No pneumothorax. Left lung clear. Heart size upper limits normal for technique. Atheromatous aortic arch. IMPRESSION: 1. Right rib fractures without pneumothorax. 2. Right pleural effusion with lower lung consolidation/atelectasis. Electronically Signed   By: Corlis Leak M.D.   On: 08/29/2015 12:40   Dg Hips Bilat With Pelvis 2v  08/29/2015  CLINICAL DATA:  82 year old female with fall and bilateral hip pain. EXAM: DG HIP (WITH OR WITHOUT PELVIS) 2V BILAT COMPARISON:  CT of the right lower extremity dated 03/10/2015 go and multiple radiograph dated back to 08/06/2014 FINDINGS: No acute fracture identified. There is a total right hip arthroplasty. No dislocation. There is old fracture of the greater trochanter of the right femur as well as old fracture of the proximal right femur. There is also old fracture of the greater trochanter of the left femur. The bones are osteopenic. There is moderate stool throughout the colon. The soft tissues are otherwise grossly unremarkable. IMPRESSION: No acute fracture or dislocation. Total right hip arthroplasty. Electronically Signed   By: Elgie Collard M.D.   On: 08/29/2015 10:56     Medical Consultants:    None.  Anti-Infectives:   Vancomycin 08/29/15---> Aztreonam 08/29/15--->   Subjective:   Dawn Kaufman is pleasantly confused. She is currently without significant complaints. She has 2 granddaughters at the bedside doting over her. She had an active coughing episode when they attempted to give her something to drink.  Objective:    Filed Vitals:   08/31/15 1447 08/31/15 2152 09/01/15 0621 09/01/15 0800  BP: 129/57 155/70 158/78 151/68  Pulse:  92 90 86  Temp: 97.9 F (36.6 C) 98.3 F (36.8 C) 98.1 F (36.7 C) 97.6 F (36.4 C)  TempSrc: Oral Oral Oral Oral  Resp: Height:      Weight:      SpO2: 93% 95% 91% 93%    Intake/Output Summary (Last 24 hours) at 09/01/15 0946 Last data filed  at 08/31/15 1800  Gross per 24 hour  Intake    330 ml  Output    203 ml  Net    127 ml   Filed Weights   08/29/15 1624  Weight: 37.6 kg (82 lb 14.3 oz)    Exam: Gen:  NAD, pleasantly confused Cardiovascular:  RRR, No M/R/G Respiratory:  Lungs diminished Gastrointestinal:  Abdomen soft, NT/ND, + BS Extremities:  No C/E/C   Data Reviewed:    Labs: Basic Metabolic Panel:  Recent Labs Lab 08/29/15 1001 08/30/15 0607 08/31/15 0547 09/01/15 0522  NA 133* 139 136 139  K 3.6 3.4* 3.3* 3.7  CL 98* 105 104 105  CO2 GLUCOSE 154* 179* 170* 157*  BUN 23* 22* 19 15  CREATININE 0.70 0.69 0.64 0.54  CALCIUM 8.9 8.7* 8.2* 8.5*   GFR Estimated Creatinine Clearance: 30 mL/min (by C-G formula based on Cr of 0.54). Liver Function Tests:  Recent Labs Lab 09/01/15 0522  AST 16  ALT 16  ALKPHOS 110  BILITOT 0.4  PROT 5.3*  ALBUMIN 2.1*   No results for input(s): LIPASE, AMYLASE in the last 168 hours. No results for input(s): AMMONIA in the last 168 hours. Coagulation profile No results for input(s): INR, PROTIME in the last 168 hours.  CBC:  Recent Labs Lab 08/29/15 1001 08/30/15 0607 08/31/15 0547 09/01/15 0522  WBC 20.4* 21.6* 17.6* 15.4*  NEUTROABS 18.6* 19.4*  --   --   HGB 10.1* 10.0* 9.3* 9.3*  HCT 31.7* 30.5* 29.0* 28.7*  MCV 90.3 91.0 90.6 90.0  PLT 525* 479* 455* 465*   Cardiac Enzymes:  Recent Labs Lab 08/29/15 1001  CKTOTAL 424*   CBG:  Recent Labs Lab 08/31/15 0726 08/31/15 1149 08/31/15 1705 08/31/15 2155 09/01/15 0736  GLUCAP 162* 114* 190* 109* 129*   Thyroid function studies:  Recent Labs  08/30/15 0607  TSH 0.616   Sepsis Labs:  Recent Labs Lab 08/29/15 1001 08/30/15 0607 08/31/15 0547 09/01/15 0522  WBC 20.4* 21.6* 17.6* 15.4*   Microbiology Recent Results (from the past 240 hour(s))  Urine culture     Status: None   Collection Time: 08/29/15 10:50 AM  Result Value Ref Range Status   Specimen  Description URINE, CATHETERIZED  Final   Special Requests NONE  Final   Culture   Final    >=100,000 COLONIES/mL ESCHERICHIA COLI Performed at [facility]    Report Status 08/31/2015 FINAL  Final   Organism ID, Bacteria ESCHERICHIA COLI  Final      Susceptibility   Escherichia coli - MIC*    AMPICILLIN <=2 SENSITIVE Sensitive     CEFAZOLIN <=4 SENSITIVE Sensitive     CEFTRIAXONE <=1 SENSITIVE Sensitive     CIPROFLOXACIN <=0.25 SENSITIVE Sensitive     GENTAMICIN <=1 SENSITIVE Sensitive     IMIPENEM <=0.25 SENSITIVE Sensitive     NITROFURANTOIN <=16 SENSITIVE Sensitive     TRIMETH/SULFA <=20 SENSITIVE Sensitive     AMPICILLIN/SULBACTAM <=2 SENSITIVE Sensitive     PIP/TAZO <=4 SENSITIVE Sensitive     * >=100,000 COLONIES/mL ESCHERICHIA COLI  Culture, blood (routine x 2) Call MD if unable to obtain prior to antibiotics being given     Status: None (Preliminary result)   Collection Time: 08/29/15  4:23 PM  Result Value Ref Range Status   Specimen Description BLOOD LEFT ARM  Final   Special Requests IN PEDIATRIC BOTTLE  4CC  Final   Culture   Final    NO GROWTH 2 DAYS Performed at [facility]    Report Status PENDING  Incomplete  Culture, blood (routine x 2) Call MD if unable to obtain prior to antibiotics being given     Status: None (Preliminary result)   Collection Time: 08/29/15  5:58 PM  Result Value Ref Range Status   Specimen Description BLOOD LEFT ARM  Final   Special Requests BOTTLES DRAWN AEROBIC AND ANAEROBIC  10CC  Final   Culture   Final    NO GROWTH 2 DAYS Performed at [facility]    Report Status PENDING  Incomplete  MRSA PCR Screening     Status: Abnormal   Collection Time: 08/29/15  6:30 PM  Result Value Ref Range Status   MRSA by PCR POSITIVE (A) NEGATIVE Final    Comment:        The GeneXpert MRSA Assay (FDA approved for NASAL specimens only), is one component of a comprehensive MRSA colonization surveillance program. It is  not intended to diagnose MRSA infection nor to guide or monitor treatment for MRSA infections. RESULT CALLED TO, READ BACK BY AND VERIFIED WITH: Alvia Grove [id number] @ 2021 BY J SCOTTON      Medications:   . aspirin EC  81 mg Oral Daily  . aztreonam  1 g Intravenous 3 times per day  . bacitracin   Topical BID  . chlorhexidine  15 mL Mouth/Throat BID  . Chlorhexidine Gluconate Cloth  6 each Topical Q0600  . insulin  aspart  0-9 Units Subcutaneous TID WC  . mupirocin ointment   Nasal BID  . pantoprazole  40 mg Oral Daily  . vancomycin  500 mg Intravenous Q24H  . venlafaxine XR  150 mg Oral Q breakfast   Continuous Infusions: . 0.9 % NaCl with KCl 40 mEq / L 75 mL/hr (08/31/15 1221)    Time spent: 25 minutes.   LOS: 3 days   Jordy Verba  [facility] Pager [phone]. If unable to reach me by pager, please call my cell phone at [phone].  *Please refer to [email or web address], password [email or web address] to get updated schedule on who will round on this patient, as hospitalists switch teams weekly. If 7PM-7AM, please contact night-coverage at [email or web address], password [email or web address] for any overnight needs.  09/01/2015, 9:46 AM

## 2015-09-02 LAB — VANCOMYCIN, TROUGH: Vancomycin Tr: <4 ug/mL — ABNORMAL LOW (ref 10.0–20.0)

## 2015-09-02 LAB — GLUCOSE, CAPILLARY
GLUCOSE-CAPILLARY: 188 mg/dL — AB (ref 65–99)
GLUCOSE-CAPILLARY: 158 mg/dL — AB (ref 65–99)
Glucose-Capillary: 131 mg/dL — ABNORMAL HIGH (ref 65–99)
Glucose-Capillary: 156 mg/dL — ABNORMAL HIGH (ref 65–99)

## 2015-09-02 MED ORDER — POLYETHYLENE GLYCOL 3350 17 G PO PACK
17.0000 g | PACK | Freq: Every day | ORAL | Status: DC
  Administered 2015-09-02 – 2015-09-03 (×2): 17 g via ORAL
  Filled 2015-09-02 (×2): qty 1

## 2015-09-02 MED ORDER — VANCOMYCIN HCL 500 MG IV SOLR
500.0000 mg | Freq: Two times a day (BID) | INTRAVENOUS | Status: DC
  Administered 2015-09-02: 500 mg via INTRAVENOUS
  Filled 2015-09-02 (×2): qty 500

## 2015-09-02 NOTE — Clinical Social Work Maternal (Signed)
CSW met with pt's daughter at bedside to discuss discharge.  CSW encouraged pt's daughter to discuss history and current needs.  CSW provided supportive listening.  CSW provided the information that pt had a private bed at [facility] today and the SNF would like her daughter to sign paperwork at 1:30.  CSW let [facility] know that daughter will be there today to sign paperwork.  CSW will follow up with pt until discharge.  Dede Query, LCSW [place] Worker - Weekend Coverage cell #: [phone]

## 2015-09-02 NOTE — Progress Notes (Signed)
Speech Language Pathology Treatment: Dysphagia  Patient Details Name: Dawn Kaufman MRN: [id number] DOB: 12/18/1926 Today's Date: 09/02/2015 Time: 4171-2787 SLP Time Calculation (min) (ACUTE ONLY): 16 min  Assessment / Plan / Recommendation Clinical Impression  Fortunately pt's dysphagia symptoms have largely resolved!  Today voice was strong and clear.  SLP assisted pt to place partials after SLP cleaned them.  Pt consumed graham crackers, applesauce and tea via straw benefiting from verbal cues to focus on tasks.  She was distracted by her IV and by discomfort in chair. No indications of dysphagia with po observed and pt stated "My swallowing is better".    Uncertain to source of acute onset of dysphagia, odynphagia and poor neck positioning however much improvement noted, suspect due to medical progression.    SLP to sign off, Recommend to continue dys3/thin while in hospital due to pt having partials that she does not always use and premorbid occasional "choking"on food.    Advised NTs after session that pt desires to be repositioned.        HPI HPI: 82 yo female adm to [facility] with AMS, recent fall and decreased intake. Pt found to have rib fx - uncertain if new = and pleural effusion.  Swallow evaluation ordered.  PMH + for HTN, DM,  fall, femur fx, constipation, GERD.  BSE completed yesterday with recommendations for mitigation strategies.        SLP Plan  All goals met     Recommendations  Diet recommendations: Dysphagia 3 (mechanical soft);Thin liquid Liquids provided via: Cup;Straw Medication Administration: Whole meds with puree Supervision: Patient able to self feed;Intermittent supervision to cue for compensatory strategies Compensations: Small sips/bites;Slow rate             Follow up Recommendations: None Plan: All goals met     [place], [place] [facility] SLP [phone]

## 2015-09-02 NOTE — Progress Notes (Addendum)
Progress Note   Dawn Kaufman ZOX:[id number] DOB: 10/31/1926 DOA: 08/29/2015 PCP: Dawn Peri, MD   Brief Narrative:   Dawn Kaufman is an 82 y.o. female with a PMH of dementia, hypertension, diabetes, and depression who has had recent falls and was admitted 08/29/15 with altered mental status and head injury/forehead laceration/fractures of the ribs on the right side secondary to fall.  Assessment/Plan:   Principal Problem:   Acute encephalopathy in the setting of microvascular cerebrovascular disease and cerebral atrophy/dementia - Secondary to Escherichia coli UTI and healthcare associated pneumonia. - Treat underlying infections. - More alert today.  Active Problems:   Fall resulting in head injury/forehead laceration and Closed fracture of seven ribs of right side - Seen by physical therapy 08/31/15 with SNF versus 24 hour supervision at home recommended.     MRSA carrier - Continue contact precautions and decontamination therapy.    Diabetes mellitus, type 2 (HCC) - Currently being managed with insulin sensitive SSI. CBGs 91-218.    UTI (lower urinary tract infection) - Urine cultures positive for Escherichia coli. Aztreonam will cover.     Healthcare-associated aspiration pneumonia/dysphasia - Follow-up blood and sputum cultures.  - Continue vancomycin and aztreonam.  - Evaluated by speech therapist. Felt to be at some risk for aspiration. Aspiration precautions initiated.     DVT Prophylaxis - Lovenox ordered.   Family Communication/Anticipated D/C date and plan/Code Status   Family Communication: Dawn Kaufman (daughter) updated at bedside. Disposition Plan: From [facility], ALF. Will likely need SNF at discharge. Anticipated D/C date:  09/03/15. Code Status:  DO NOT RESUSCITATE.    IV Access:    Peripheral IV   Procedures and diagnostic studies:   Ct Head and cervical spine Wo Contrast  08/29/2015  CLINICAL DATA:  Right forehead laceration  following a fall onto the floor this morning. Altered mental status. EXAM: CT HEAD WITHOUT CONTRAST CT CERVICAL SPINE WITHOUT CONTRAST TECHNIQUE: Multidetector CT imaging of the head and cervical spine was performed following the standard protocol without intravenous contrast. Multiplanar CT image reconstructions of the cervical spine were also generated. COMPARISON:  Head CT dated 08/07/2014. FINDINGS: CT HEAD FINDINGS Diffusely enlarged ventricles and subarachnoid spaces. Patchy white matter low density in both cerebral hemispheres. Stable small amount of calcification within an area of low density in the left frontal white matter. No skull fracture, intracranial hemorrhage or paranasal sinus air-fluid levels. CT CERVICAL SPINE FINDINGS Mild levoconvex scoliosis. Multilevel degenerative changes. Mild partially calcified pannus around the dens. No prevertebral soft tissue swelling, fractures or subluxations. Bullous changes in both upper lobes with biapical pleural and parenchymal scarring, greater on the right. Bilateral carotid artery calcifications. IMPRESSION: 1. No skull fracture or intracranial hemorrhage. 2. No cervical spine fracture or subluxation. 3. Stable diffuse cerebral and cerebellar atrophy. 4. Mildly progressive chronic small vessel white matter ischemic changes in both cerebral hemispheres. 5. Stable left frontal lobe dystrophic calcification. 6. Bilateral carotid artery atheromatous calcifications. 7. COPD. Electronically Signed   By: Beckie Salts M.D.   On: 08/29/2015 10:40    Dg Chest Portable 1 View  08/29/2015  CLINICAL DATA:  Unwitnessed fall today; no chest complaints per pt; per Staff at nursing home: Staff stated increased confusion for the past week and patient had a strong urine odor.; HTN; ex smoker EXAM: PORTABLE CHEST - 1 VIEW COMPARISON:  03/09/2015 FINDINGS: Fracture of the anterolateral aspect right sixth and seventh ribs, not evident on the prior study. Right pleural effusion  with  adjacent consolidation/ atelectasis in the right lower lung. No pneumothorax. Left lung clear. Heart size upper limits normal for technique. Atheromatous aortic arch. IMPRESSION: 1. Right rib fractures without pneumothorax. 2. Right pleural effusion with lower lung consolidation/atelectasis. Electronically Signed   By: Corlis Leak M.D.   On: 08/29/2015 12:40   Dg Hips Bilat With Pelvis 2v  08/29/2015  CLINICAL DATA:  82 year old female with fall and bilateral hip pain. EXAM: DG HIP (WITH OR WITHOUT PELVIS) 2V BILAT COMPARISON:  CT of the right lower extremity dated 03/10/2015 go and multiple radiograph dated back to 08/06/2014 FINDINGS: No acute fracture identified. There is a total right hip arthroplasty. No dislocation. There is old fracture of the greater trochanter of the right femur as well as old fracture of the proximal right femur. There is also old fracture of the greater trochanter of the left femur. The bones are osteopenic. There is moderate stool throughout the colon. The soft tissues are otherwise grossly unremarkable. IMPRESSION: No acute fracture or dislocation. Total right hip arthroplasty. Electronically Signed   By: Elgie Collard M.D.   On: 08/29/2015 10:56     Medical Consultants:    None.  Anti-Infectives:   Vancomycin 08/29/15---> Aztreonam 08/29/15--->   Subjective:   Dawn Kaufman is more alert today, but remains a bit confused.  Complains of constipation.  No N/V.  Pain controlled, but gets uncomfortable easily.  No dyspnea.  Objective:    Filed Vitals:   09/01/15 0800 09/01/15 1321 09/01/15 2226 09/02/15 0701  BP: 151/68 145/63 164/75 153/70  Pulse: 86 85 77 81  Temp: 97.6 F (36.4 C) 98.4 F (36.9 C) 98.3 F (36.8 C) 98.3 F (36.8 C)  TempSrc: Oral Oral Oral Oral  Resp: Height:      Weight:      SpO2: 93% 94% 97% 97%    Intake/Output Summary (Last 24 hours) at 09/02/15 0837 Last data filed at 09/02/15 0836  Gross per 24 hour    Intake    900 ml  Output    100 ml  Net    800 ml   Filed Weights   08/29/15 1624  Weight: 37.6 kg (82 lb 14.3 oz)    Exam: Gen:  NAD, pleasantly confused, more alert today Cardiovascular:  RRR, No M/R/G Respiratory:  Lungs diminished Gastrointestinal:  Abdomen soft, NT/ND, + BS Extremities:  No C/E/C   Data Reviewed:    Labs: Basic Metabolic Panel:  Recent Labs Lab 08/29/15 1001 08/30/15 0607 08/31/15 0547 09/01/15 0522  NA 133* 139 136 139  K 3.6 3.4* 3.3* 3.7  CL 98* 105 104 105  CO2 GLUCOSE 154* 179* 170* 157*  BUN 23* 22* 19 15  CREATININE 0.70 0.69 0.64 0.54  CALCIUM 8.9 8.7* 8.2* 8.5*   GFR Estimated Creatinine Clearance: 30 mL/min (by C-G formula based on Cr of 0.54). Liver Function Tests:  Recent Labs Lab 09/01/15 0522  AST 16  ALT 16  ALKPHOS 110  BILITOT 0.4  PROT 5.3*  ALBUMIN 2.1*   No results for input(s): LIPASE, AMYLASE in the last 168 hours. No results for input(s): AMMONIA in the last 168 hours. Coagulation profile No results for input(s): INR, PROTIME in the last 168 hours.  CBC:  Recent Labs Lab 08/29/15 1001 08/30/15 0607 08/31/15 0547 09/01/15 0522  WBC 20.4* 21.6* 17.6* 15.4*  NEUTROABS 18.6* 19.4*  --   --   HGB 10.1* 10.0* 9.3* 9.3*  HCT 31.7* 30.5* 29.0* 28.7*  MCV 90.3 91.0 90.6 90.0  PLT 525* 479* 455* 465*   Cardiac Enzymes:  Recent Labs Lab 08/29/15 1001  CKTOTAL 424*   CBG:  Recent Labs Lab 09/01/15 0736 09/01/15 1139 09/01/15 1740 09/01/15 2219 09/02/15 0734  GLUCAP 129* 117* 218* 91 131*   Microbiology Recent Results (from the past 240 hour(s))  Urine culture     Status: None   Collection Time: 08/29/15 10:50 AM  Result Value Ref Range Status   Specimen Description URINE, CATHETERIZED  Final   Special Requests NONE  Final   Culture   Final    >=100,000 COLONIES/mL ESCHERICHIA COLI Performed at [facility]    Report Status 08/31/2015 FINAL  Final   Organism ID,  Bacteria ESCHERICHIA COLI  Final      Susceptibility   Escherichia coli - MIC*    AMPICILLIN <=2 SENSITIVE Sensitive     CEFAZOLIN <=4 SENSITIVE Sensitive     CEFTRIAXONE <=1 SENSITIVE Sensitive     CIPROFLOXACIN <=0.25 SENSITIVE Sensitive     GENTAMICIN <=1 SENSITIVE Sensitive     IMIPENEM <=0.25 SENSITIVE Sensitive     NITROFURANTOIN <=16 SENSITIVE Sensitive     TRIMETH/SULFA <=20 SENSITIVE Sensitive     AMPICILLIN/SULBACTAM <=2 SENSITIVE Sensitive     PIP/TAZO <=4 SENSITIVE Sensitive     * >=100,000 COLONIES/mL ESCHERICHIA COLI  Culture, blood (routine x 2) Call MD if unable to obtain prior to antibiotics being given     Status: None (Preliminary result)   Collection Time: 08/29/15  4:23 PM  Result Value Ref Range Status   Specimen Description BLOOD LEFT ARM  Final   Special Requests IN PEDIATRIC BOTTLE  4CC  Final   Culture   Final    NO GROWTH 3 DAYS Performed at [facility]    Report Status PENDING  Incomplete  Culture, blood (routine x 2) Call MD if unable to obtain prior to antibiotics being given     Status: None (Preliminary result)   Collection Time: 08/29/15  5:58 PM  Result Value Ref Range Status   Specimen Description BLOOD LEFT ARM  Final   Special Requests BOTTLES DRAWN AEROBIC AND ANAEROBIC  10CC  Final   Culture   Final    NO GROWTH 3 DAYS Performed at [facility]    Report Status PENDING  Incomplete  MRSA PCR Screening     Status: Abnormal   Collection Time: 08/29/15  6:30 PM  Result Value Ref Range Status   MRSA by PCR POSITIVE (A) NEGATIVE Final    Comment:        The GeneXpert MRSA Assay (FDA approved for NASAL specimens only), is one component of a comprehensive MRSA colonization surveillance program. It is not intended to diagnose MRSA infection nor to guide or monitor treatment for MRSA infections. RESULT CALLED TO, READ BACK BY AND VERIFIED WITH: Alvia Grove [id number] @ 2021 BY J SCOTTON      Medications:   . aspirin EC   81 mg Oral Daily  . aztreonam  1 g Intravenous 3 times per day  . bacitracin   Topical BID  . chlorhexidine  15 mL Mouth/Throat BID  . Chlorhexidine Gluconate Cloth  6 each Topical Q0600  . enoxaparin (LOVENOX) injection  30 mg Subcutaneous Q24H  . insulin aspart  0-9 Units Subcutaneous TID WC  . mupirocin ointment   Nasal BID  . pantoprazole  40 mg Oral Daily  . vancomycin  500 mg Intravenous Q24H  . venlafaxine XR  150 mg Oral Q breakfast   Continuous Infusions: . 0.9 % NaCl with KCl 40 mEq / L 75 mL/hr (09/02/15 0244)    Time spent: 25 minutes.   LOS: 4 days   P[place]  [facility] Pager [phone]. If unable to reach me by pager, please call my cell phone at [phone].  *Please refer to [email or web address], password [email or web address] to get updated schedule on who will round on this patient, as hospitalists switch teams weekly. If 7PM-7AM, please contact night-coverage at [email or web address], password [email or web address] for any overnight needs.  09/02/2015, 8:37 AM

## 2015-09-02 NOTE — Clinical Social Work Note (Signed)
Clinical Social Work Assessment  Patient Details  Name: Dawn Kaufman MRN: [id number] Date of Birth: [birth year]  Date of referral:  09/02/15               Reason for consult:  Facility Placement                Permission sought to share information with:  Facility Art therapist granted to share information::  Yes, Verbal Permission Granted  Name::        Agency::     Relationship::     Contact Information:     Housing/Transportation Living arrangements for the past 2 months:  [place] ([facility]) Source of Information:  Adult Children (daughter) Patient Interpreter Needed:    Criminal Activity/Legal Involvement Pertinent to Current Situation/Hospitalization:    Significant Relationships:  Adult Children Lives with:  Facility Resident Do you feel safe going back to the place where you live?    Need for family participation in patient care:  Yes (Comment)  Care giving concerns:  Pt's daughter is concerned that pt has "given up" with regards to her life.   Social Worker assessment / plan:  CSW met with pt and her daughter at bedside.  Pt was asleep and per RN was up all night so sleeping at this point is best for pt.  CSW provided information to pt's daughter regarding at SNF bed at [facility] and coordinated her signing paperwork today at 1:30pm.  CSW encouraged pt's daughter to provide history.  CSW provided supportive listening.  CSW will follow up with RN to assess whether pt will need PTAR at discharge.  CSW will continue to follow up with pt until discharge  Employment status:  Retired Insurance underwriter information:  Medicare PT Recommendations:  [place] / Referral to community resources:     Patient/Family's Response to care:  Pt's daughter reported that pt had only just been admitted to [facility] one month ago. Pt had been living independently until last month and daughter thinks that pt "has given up" on her  life.  Pt's daughter unsure if pt will need long term at SNF versus going back to ALF and will assess this at rehab discharge.  Pt's daughter going to [facility] to sign paperwork at 1:30 pm today  Patient/Family's Understanding of and Emotional Response to Diagnosis, Current Treatment, and Prognosis:  Pt asleep.  Pt's daughter stated that pt's confusion began to get worse when she went into ALF one month ago.  Pt also could not stand on her own which was new for pt who had used a walker.  Pt has a UTI and her daughter is hoping when this clears her confusion will also clear.   Emotional Assessment Appearance:  Appears stated age Attitude/Demeanor/Rapport:  Unable to Assess (pt sleeping) Affect (typically observed):  Unable to Assess (pt sleeping) Orientation:  Oriented to Self, Oriented to Situation, Oriented to Place Alcohol / Substance use:    Psych involvement (Current and /or in the community):     Discharge Needs  Concerns to be addressed:    Readmission within the last 30 days:    Current discharge risk:    Barriers to Discharge:  No Barriers Identified   Carlean Jews, LCSW 09/02/2015, 12:45 PM

## 2015-09-02 NOTE — Progress Notes (Signed)
Pharmacy Antibiotic Note  32 yoF with hx dementia from ALF with worsening confusion and fall.  U/A +nitrites, few bacteria.  CXR shows right rib fractures without pneumothorax and an area of atelectasis vs consolidation with pleural effusion.  Pharmacy consulted to start Vancomycin for possible PNA and UTI. MD dosing aztreonam due to PCN allergies (rxn unknown).    - Urine culture now with Ecoli;  blood cultures have been negative thus far. - afeb, wbc elevated but trending down, scr stable (crcl~30)  Plan: - continue Vancomycin  IV q24h.   Will obtain vanc trough today, adjust dose accordingly - Aztreonam 1g IV q8h   Height:  (162.6 cm) Weight: 82 lb 14.3 oz (37.6 kg) IBW/kg (Calculated) : 54.7 Temp (24hrs), Avg:98.3 F (36.8 C), Min:98.3 F (36.8 C), Max:98.3 F (36.8 C)   Recent Labs Lab 08/29/15 1001 08/30/15 0607 08/31/15 0547 09/01/15 0522  WBC 20.4* 21.6* 17.6* 15.4*  CREATININE 0.70 0.69 0.64 0.54    Estimated Creatinine Clearance: 30 mL/min (by C-G formula based on Cr of 0.54).    Allergies  Allergen Reactions  . Penicillins Swelling    Has patient had a PCN reaction causing immediate rash, facial/tongue/throat swelling, SOB or lightheadedness with hypotension: unknown Has patient had a PCN reaction causing severe rash involving mucus membranes or skin necrosis: unknown Has patient had a PCN reaction that required hospitalization: unknown Has patient had a PCN reaction occurring within the last 10 years: unknown If all of the above answers are "NO", then may proceed with Cephalosporin use.   . Latex Hives  . Shellfish Allergy     Develops gout with frequent consumption of shellfish    Antimicrobials this admission: 2/20 >> Vancomycin >> 2/20 >> Aztreonam >>   Dose adjustments this admission: 2/24 VT @ 1600 _____  Microbiology results: 2/20 BCx x2: NGTD 2/20 UCx: >100K EColi pansensitive FINAL 2/20 Strep pneumo urinary antigen: neg 2/20 MRSA  positive   Thank you for allowing pharmacy to be a part of this patient's care.  Arley Phenix RPh 09/02/2015, 1:24 PM Pager [phone]

## 2015-09-02 NOTE — Progress Notes (Signed)
Physical Therapy Treatment Patient Details Name: Dawn Kaufman MRN: [id number] DOB: 08/12/1926 Today's Date: 09/02/2015    History of Present Illness 82 year old female with history of baseline dementia, hypertension, diabetes, depression, right hip arthroplasty resents with altered mental status and recent multiple falls, most likely due to infectious process including pneumonia and UTI.  Pt also with Closed fracture of seven ribs of right side    PT Comments    Granddaughter in room and assisted.  Pt is HOH and appears nervous/mild anxiety.  Assisted to EOB required increased time and repeat functional VC's 75%.  Assisted to [facility] in chance pt needed to void as she wears incont briefs.  Pt did void.  Assisted with standing then personal hygiene.  Assisted with amb a limited distance in hallway.  Unsteady gait.  Poor posture.  Impaired cognition.  HIGH FALL RISK. Pt will need ST Rehab at SNF prior to returning to [facility].  Follow Up Recommendations  SNF     Equipment Recommendations  None recommended by PT    Recommendations for Other Services       Precautions / Restrictions Precautions Precautions: Fall Precaution Comments: multiple falls recently/incontin urine Restrictions Weight Bearing Restrictions: No    Mobility  Bed Mobility Overal bed mobility: Needs Assistance Bed Mobility: Supine to Sit     Supine to sit: Mod assist;HOB elevated     General bed mobility comments: verbal cues for self assist, pt requiring multimodal cues  Transfers Overall transfer level: Needs assistance Equipment used: None Transfers: Sit to/from [employer] Transfers Sit to Stand: Mod assist         General transfer comment: multimodal cues for technique, assist to rise, steady and control descent, takes small shuffling steps over to [facility]  Ambulation/Gait Ambulation/Gait assistance: Mod assist;+2 safety/equipment Ambulation Distance (Feet): 8 Feet Assistive device:  Rolling walker (2 wheeled) Gait Pattern/deviations: Step-to pattern;Step-through pattern;Decreased step length - right;Decreased step length - left;Decreased stride length;Trunk flexed Gait velocity: decreased   General Gait Details: 75% VC's to increase gait distance.  Very unsteady.  Nervous.  Poor safety cognition.     Stairs            Wheelchair Mobility    Modified Rankin (Stroke Patients Only)       Balance                                    Cognition Arousal/Alertness: Awake/alert Behavior During Therapy: Anxious                        Exercises      General Comments        Pertinent Vitals/Pain Pain Assessment: Faces Faces Pain Scale: Hurts a little bit Pain Location: ABD    Home Living                      Prior Function            PT Goals (current goals can now be found in the care plan section) Progress towards PT goals: Progressing toward goals    Frequency  Min 3X/week    PT Plan Current plan remains appropriate    Co-evaluation             End of Session Equipment Utilized During Treatment: Gait belt Activity Tolerance: Patient limited by fatigue Patient left: in chair;with call bell/phone  within reach;with chair alarm set;with family/visitor present (GrandDaughter)     Time: 7829-5621 PT Time Calculation (min) (ACUTE ONLY): 25 min  Charges:  $Gait Training: 8-22 mins $Therapeutic Activity: 8-22 mins                    G Codes:      Felecia Shelling  PTA [facility]  Acute  Rehab Pager      [phone]

## 2015-09-02 NOTE — Progress Notes (Signed)
Pharmacy note: Vancomycin  Patient on Vancomycin  IV q24h for HCAP.  See full consult note from earlier today.    2/24 VT @ 1600 <4 (goal 15-20)  All Vancomycin doses charted as given.  Patient clearing Vancomycin better than estimated CrCl (CG) of 57ml/min.  NCrCl~80 ml/min.    Increase Vancomycin to  IV q12. Recheck Vancomycin level at steady-state if continues.   Junita Push, PharmD, BCPS Pager: [phone] 09/02/2015@7 :43 PM

## 2015-09-03 DIAGNOSIS — S0181XD Laceration without foreign body of other part of head, subsequent encounter: Secondary | ICD-10-CM

## 2015-09-03 LAB — CULTURE, BLOOD (ROUTINE X 2)
CULTURE: NO GROWTH
CULTURE: NO GROWTH

## 2015-09-03 LAB — GLUCOSE, CAPILLARY
GLUCOSE-CAPILLARY: 135 mg/dL — AB (ref 65–99)
Glucose-Capillary: 180 mg/dL — ABNORMAL HIGH (ref 65–99)

## 2015-09-03 MED ORDER — SULFAMETHOXAZOLE-TRIMETHOPRIM 800-160 MG PO TABS
1.0000 | ORAL_TABLET | Freq: Two times a day (BID) | ORAL | Status: DC
  Administered 2015-09-03: 1 via ORAL
  Filled 2015-09-03 (×2): qty 1

## 2015-09-03 MED ORDER — OXYCODONE HCL 5 MG PO TABS: 5.0000 mg | ORAL_TABLET | Freq: Four times a day (QID) | ORAL | Status: AC | PRN

## 2015-09-03 MED ORDER — POLYETHYLENE GLYCOL 3350 17 G PO PACK: 17.0000 g | PACK | Freq: Every day | ORAL | Status: AC

## 2015-09-03 MED ORDER — MORPHINE SULFATE (PF) 2 MG/ML IV SOLN
1.0000 mg | Freq: Once | INTRAVENOUS | Status: AC
  Administered 2015-09-03: 1 mg via INTRAVENOUS
  Filled 2015-09-03: qty 1

## 2015-09-03 MED ORDER — LORAZEPAM 0.5 MG PO TABS: 0.2500 mg | ORAL_TABLET | Freq: Two times a day (BID) | ORAL | Status: AC | PRN

## 2015-09-03 MED ORDER — ACETAMINOPHEN 325 MG PO TABS: 650.0000 mg | ORAL_TABLET | Freq: Four times a day (QID) | ORAL | Status: AC | PRN

## 2015-09-03 MED ORDER — SULFAMETHOXAZOLE-TRIMETHOPRIM 800-160 MG PO TABS: 1.0000 | ORAL_TABLET | Freq: Two times a day (BID) | ORAL | Status: AC

## 2015-09-03 NOTE — Discharge Summary (Signed)
Physician Discharge Summary  Dawn RIESTER Kaufman:[id number] DOB: [birth year] DOA: 08/29/2015  PCP: Dawn Peri, MD  Admit date: 08/29/2015 Discharge date: 09/03/2015   Recommendations for Outpatient Follow-Up:   1. The patient is being discharged to [facility].   Discharge Diagnosis:   Principal Problem:    Acute encephalopathy secondary to HCAP and UTI Active Problems:    Closed fracture of seven ribs of right side    Head injury    Diabetes mellitus, type 2 (HCC)    UTI (lower urinary tract infection)    Healthcare-associated pneumonia    Fall    Forehead laceration    MRSA carrier   Discharge disposition:  SNF: [facility]  Discharge Condition: Improved.  Diet recommendation: Low sodium, heart healthy.  Aspiration precautions.  History of Present Illness:   Dawn Kaufman is an 82 y.o. female with a PMH of dementia, hypertension, diabetes, and depression who has had recent falls and was admitted 08/29/15 with altered mental status and head injury/forehead laceration/fractures of the ribs on the right side secondary to fall.  Hospital Course by Problem:      Principal Problem:  Acute encephalopathy in the setting of microvascular cerebrovascular disease and cerebral atrophy/dementia - Secondary to Escherichia coli UTI and healthcare associated pneumonia. - Improved with treatment of underlying infections.  Active Problems:  Fall resulting in head injury/forehead laceration and Closed fracture of seven ribs of right side - Seen by physical therapy 08/31/15 with SNF versus 24 hour supervision at home recommended.  - Tylenol and oxycodone ordered PRN pain.   MRSA carrier - Treated with contact precautions and decontamination therapy.   Diabetes mellitus, type 2 (HCC) - Currently being managed with insulin sensitive SSI. CBGs 91-218. - Resume Metformin at discharge.   UTI (lower urinary tract infection) - Urine cultures positive for  Escherichia coli.  - Treated with Aztreonam with antibiotics narrowed to Septra on 09/02/15.    Healthcare-associated aspiration pneumonia/dysphasia - Follow-up blood and sputum cultures.  - Treated with vancomycin and aztreonam. D/C to SNF on 2 more days of treatment with Septra. - Evaluated by speech therapist. Felt to be at some risk for aspiration. Aspiration precautions initiated.          Medical Consultants:    None.   Discharge Exam:   Filed Vitals:   09/02/15 2107 09/03/15 0651  BP: 139/66 161/72  Pulse: 72 82  Temp: 98.4 F (36.9 C) 98.4 F (36.9 C)  Resp: 20 20   Filed Vitals:   09/02/15 0701 09/02/15 1555 09/02/15 2107 09/03/15 0651  BP: 153/70 138/64 139/66 161/72  Pulse: 81 81 72 82  Temp: 98.3 F (36.8 C) 97.6 F (36.4 C) 98.4 F (36.9 C) 98.4 F (36.9 C)  TempSrc: Oral Oral Oral Oral  Resp: 18 20 20 20   Height:      Weight:      SpO2: 97% 93% 96% 97%    Gen:  NAD Cardiovascular:  RRR, No M/R/G Respiratory: Lungs CTAB Gastrointestinal: Abdomen soft, NT/ND with normal active bowel sounds. Extremities: No C/E/C   The results of significant diagnostics from this hospitalization (including imaging, microbiology, ancillary and laboratory) are listed below for reference.     Procedures and Diagnostic Studies:   Ct Head Wo Contrast  08/29/2015  CLINICAL DATA:  Right forehead laceration following a fall onto the floor this morning. Altered mental status. EXAM: CT HEAD WITHOUT CONTRAST CT CERVICAL SPINE WITHOUT CONTRAST TECHNIQUE: Multidetector CT imaging of the head and cervical  spine was performed following the standard protocol without intravenous contrast. Multiplanar CT image reconstructions of the cervical spine were also generated. COMPARISON:  Head CT dated 08/07/2014. FINDINGS: CT HEAD FINDINGS Diffusely enlarged ventricles and subarachnoid spaces. Patchy white matter low density in both cerebral hemispheres. Stable small amount of  calcification within an area of low density in the left frontal white matter. No skull fracture, intracranial hemorrhage or paranasal sinus air-fluid levels. CT CERVICAL SPINE FINDINGS Mild levoconvex scoliosis. Multilevel degenerative changes. Mild partially calcified pannus around the dens. No prevertebral soft tissue swelling, fractures or subluxations. Bullous changes in both upper lobes with biapical pleural and parenchymal scarring, greater on the right. Bilateral carotid artery calcifications. IMPRESSION: 1. No skull fracture or intracranial hemorrhage. 2. No cervical spine fracture or subluxation. 3. Stable diffuse cerebral and cerebellar atrophy. 4. Mildly progressive chronic small vessel white matter ischemic changes in both cerebral hemispheres. 5. Stable left frontal lobe dystrophic calcification. 6. Bilateral carotid artery atheromatous calcifications. 7. COPD. Electronically Signed   By: Beckie Salts M.D.   On: 08/29/2015 10:40   Ct Cervical Spine Wo Contrast  08/29/2015  CLINICAL DATA:  Right forehead laceration following a fall onto the floor this morning. Altered mental status. EXAM: CT HEAD WITHOUT CONTRAST CT CERVICAL SPINE WITHOUT CONTRAST TECHNIQUE: Multidetector CT imaging of the head and cervical spine was performed following the standard protocol without intravenous contrast. Multiplanar CT image reconstructions of the cervical spine were also generated. COMPARISON:  Head CT dated 08/07/2014. FINDINGS: CT HEAD FINDINGS Diffusely enlarged ventricles and subarachnoid spaces. Patchy white matter low density in both cerebral hemispheres. Stable small amount of calcification within an area of low density in the left frontal white matter. No skull fracture, intracranial hemorrhage or paranasal sinus air-fluid levels. CT CERVICAL SPINE FINDINGS Mild levoconvex scoliosis. Multilevel degenerative changes. Mild partially calcified pannus around the dens. No prevertebral soft tissue swelling,  fractures or subluxations. Bullous changes in both upper lobes with biapical pleural and parenchymal scarring, greater on the right. Bilateral carotid artery calcifications. IMPRESSION: 1. No skull fracture or intracranial hemorrhage. 2. No cervical spine fracture or subluxation. 3. Stable diffuse cerebral and cerebellar atrophy. 4. Mildly progressive chronic small vessel white matter ischemic changes in both cerebral hemispheres. 5. Stable left frontal lobe dystrophic calcification. 6. Bilateral carotid artery atheromatous calcifications. 7. COPD. Electronically Signed   By: Beckie Salts M.D.   On: 08/29/2015 10:40   Dg Chest Portable 1 View  08/29/2015  CLINICAL DATA:  Unwitnessed fall today; no chest complaints per pt; per Staff at nursing home: Staff stated increased confusion for the past week and patient had a strong urine odor.; HTN; ex smoker EXAM: PORTABLE CHEST - 1 VIEW COMPARISON:  03/09/2015 FINDINGS: Fracture of the anterolateral aspect right sixth and seventh ribs, not evident on the prior study. Right pleural effusion with adjacent consolidation/ atelectasis in the right lower lung. No pneumothorax. Left lung clear. Heart size upper limits normal for technique. Atheromatous aortic arch. IMPRESSION: 1. Right rib fractures without pneumothorax. 2. Right pleural effusion with lower lung consolidation/atelectasis. Electronically Signed   By: Corlis Leak M.D.   On: 08/29/2015 12:40   Dg Hips Bilat With Pelvis 2v  08/29/2015  CLINICAL DATA:  82 year old female with fall and bilateral hip pain. EXAM: DG HIP (WITH OR WITHOUT PELVIS) 2V BILAT COMPARISON:  CT of the right lower extremity dated 03/10/2015 go and multiple radiograph dated back to 08/06/2014 FINDINGS: No acute fracture identified. There is a total right hip  arthroplasty. No dislocation. There is old fracture of the greater trochanter of the right femur as well as old fracture of the proximal right femur. There is also old fracture of the  greater trochanter of the left femur. The bones are osteopenic. There is moderate stool throughout the colon. The soft tissues are otherwise grossly unremarkable. IMPRESSION: No acute fracture or dislocation. Total right hip arthroplasty. Electronically Signed   By: Elgie Collard M.D.   On: 08/29/2015 10:56     Labs:   Basic Metabolic Panel:  Recent Labs Lab 08/29/15 1001 08/30/15 0607 08/31/15 0547 09/01/15 0522  NA 133* 139 136 139  K 3.6 3.4* 3.3* 3.7  CL 98* 105 104 105  CO2 24 25 24 25   GLUCOSE 154* 179* 170* 157*  BUN 23* 22* 19 15  CREATININE 0.70 0.69 0.64 0.54  CALCIUM 8.9 8.7* 8.2* 8.5*   GFR Estimated Creatinine Clearance: 30 mL/min (by C-G formula based on Cr of 0.54). Liver Function Tests:  Recent Labs Lab 09/01/15 0522  AST 16  ALT 16  ALKPHOS 110  BILITOT 0.4  PROT 5.3*  ALBUMIN 2.1*   CBC:  Recent Labs Lab 08/29/15 1001 08/30/15 0607 08/31/15 0547 09/01/15 0522  WBC 20.4* 21.6* 17.6* 15.4*  NEUTROABS 18.6* 19.4*  --   --   HGB 10.1* 10.0* 9.3* 9.3*  HCT 31.7* 30.5* 29.0* 28.7*  MCV 90.3 91.0 90.6 90.0  PLT 525* 479* 455* 465*   Cardiac Enzymes:  Recent Labs Lab 08/29/15 1001  CKTOTAL 424*   BNP: Invalid input(s): POCBNP CBG:  Recent Labs Lab 09/02/15 0734 09/02/15 1139 09/02/15 1704 09/02/15 2059 09/03/15 0741  GLUCAP 131* 156* 188* 158* 135*   Microbiology Recent Results (from the past 240 hour(s))  Urine culture     Status: None   Collection Time: 08/29/15 10:50 AM  Result Value Ref Range Status   Specimen Description URINE, CATHETERIZED  Final   Special Requests NONE  Final   Culture   Final    >=100,000 COLONIES/mL ESCHERICHIA COLI Performed at [facility]    Report Status 08/31/2015 FINAL  Final   Organism ID, Bacteria ESCHERICHIA COLI  Final      Susceptibility   Escherichia coli - MIC*    AMPICILLIN <=2 SENSITIVE Sensitive     CEFAZOLIN <=4 SENSITIVE Sensitive     CEFTRIAXONE <=1 SENSITIVE  Sensitive     CIPROFLOXACIN <=0.25 SENSITIVE Sensitive     GENTAMICIN <=1 SENSITIVE Sensitive     IMIPENEM <=0.25 SENSITIVE Sensitive     NITROFURANTOIN <=16 SENSITIVE Sensitive     TRIMETH/SULFA <=20 SENSITIVE Sensitive     AMPICILLIN/SULBACTAM <=2 SENSITIVE Sensitive     PIP/TAZO <=4 SENSITIVE Sensitive     * >=100,000 COLONIES/mL ESCHERICHIA COLI  Culture, blood (routine x 2) Call MD if unable to obtain prior to antibiotics being given     Status: None   Collection Time: 08/29/15  4:23 PM  Result Value Ref Range Status   Specimen Description BLOOD LEFT ARM  Final   Special Requests IN PEDIATRIC BOTTLE  4CC  Final   Culture   Final    NO GROWTH 5 DAYS Performed at [facility]    Report Status 09/03/2015 FINAL  Final  Culture, blood (routine x 2) Call MD if unable to obtain prior to antibiotics being given     Status: None   Collection Time: 08/29/15  5:58 PM  Result Value Ref Range Status   Specimen Description BLOOD  LEFT ARM  Final   Special Requests BOTTLES DRAWN AEROBIC AND ANAEROBIC  10CC  Final   Culture   Final    NO GROWTH 5 DAYS Performed at [facility]    Report Status 09/03/2015 FINAL  Final  MRSA PCR Screening     Status: Abnormal   Collection Time: 08/29/15  6:30 PM  Result Value Ref Range Status   MRSA by PCR POSITIVE (A) NEGATIVE Final    Comment:        The GeneXpert MRSA Assay (FDA approved for NASAL specimens only), is one component of a comprehensive MRSA colonization surveillance program. It is not intended to diagnose MRSA infection nor to guide or monitor treatment for MRSA infections. RESULT CALLED TO, READ BACK BY AND VERIFIED WITH: Alvia Grove [id number] @ 2021 BY J SCOTTON      Discharge Instructions:   Discharge Instructions    Call MD for:  difficulty breathing, headache or visual disturbances    Complete by:  As directed      Call MD for:  extreme fatigue    Complete by:  As directed      Call MD for:  temperature  >100.4    Complete by:  As directed      Diet - low sodium heart healthy    Complete by:  As directed      Diet Carb Modified    Complete by:  As directed      Increase activity slowly    Complete by:  As directed             Medication List    STOP taking these medications        HYDROcodone-acetaminophen 5-325 MG tablet  Commonly known as:  LORTAB     senna-docusate 8.6-50 MG tablet  Commonly known as:  Senokot-S      TAKE these medications        acetaminophen 325 MG tablet  Commonly known as:  TYLENOL  Take 2 tablets (650 mg total) by mouth every 6 (six) hours as needed for mild pain (or Fever >/= 101).     aspirin EC 81 MG tablet  Take 81 mg by mouth daily.     feeding supplement Liqd  Take 1 Container by mouth 2 (two) times daily between meals.     LORazepam 0.5 MG tablet  Commonly known as:  ATIVAN  Take 0.5 tablets (0.25 mg total) by mouth every 12 (twelve) hours as needed for anxiety or sleep.     metFORMIN 500 MG tablet  Commonly known as:  GLUCOPHAGE  Take 500 mg by mouth daily with breakfast.     omeprazole 20 MG capsule  Commonly known as:  PRILOSEC  Take 20 mg by mouth daily with breakfast.     oxyCODONE 5 MG immediate release tablet  Commonly known as:  Oxy IR/ROXICODONE  Take 1 tablet (5 mg total) by mouth every 6 (six) hours as needed for moderate pain.     polyethylene glycol packet  Commonly known as:  MIRALAX / GLYCOLAX  Take 17 g by mouth daily.     sulfamethoxazole-trimethoprim 800-160 MG tablet  Commonly known as:  BACTRIM DS,SEPTRA DS  Take 1 tablet by mouth every 12 (twelve) hours.     venlafaxine XR 150 MG 24 hr capsule  Commonly known as:  EFFEXOR-XR  Take 150 mg by mouth daily with breakfast.          Time coordinating discharge: 35 minutes.  Signed:  Mason Dibiasio  Pager [phone] [facility] 09/03/2015, 11:47 AM

## 2015-09-03 NOTE — Clinical Social Work Note (Signed)
Patient to be d/c'ed today to [facility].  Patient and family agreeable to plans will transport via ems RN to call report to [phone].  Irl Bodie, MSW, LCSWA [phone]  

## 2015-09-03 NOTE — Progress Notes (Signed)
Report called to Pieter Partridge, LPN at [facility] nusing center.  Patient transferred to facility via EMS. Patients daughter [name] notified of patient's departure.

## 2015-10-08 DEATH — deceased

## 2018-01-04 IMAGING — CT CT CERVICAL SPINE W/O CM
4 of 6 series · 14 of 33 positions shown, 16 images · non-contrast
Comparison: Head CT dated 08/07/2014.

CLINICAL DATA: Right forehead laceration following a fall onto the
floor this morning. Altered mental status.

EXAM:
CT HEAD WITHOUT CONTRAST
CT CERVICAL SPINE WITHOUT CONTRAST
TECHNIQUE: Multidetector CT imaging of the head and cervical spine was
performed following the standard protocol without intravenous
contrast. Multiplanar CT image reconstructions of the cervical spine
were also generated.

[Series 5: c-spine st · axial · 0.32mm/px · z∈[+1012,+1102]mm · 3 of 91 slices shown]
[im 23/91  bone]
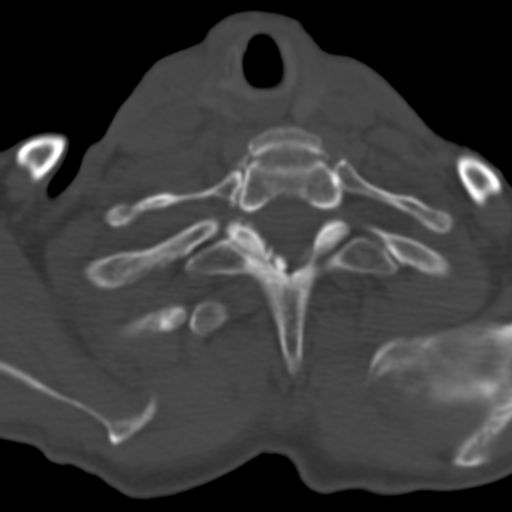
[im 46/91  bone]
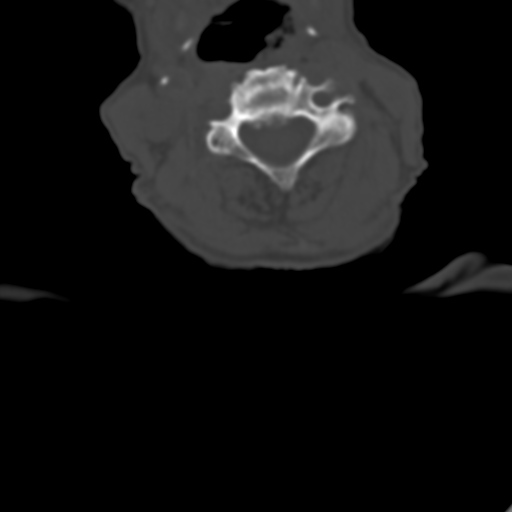
[im 68/91  bone]
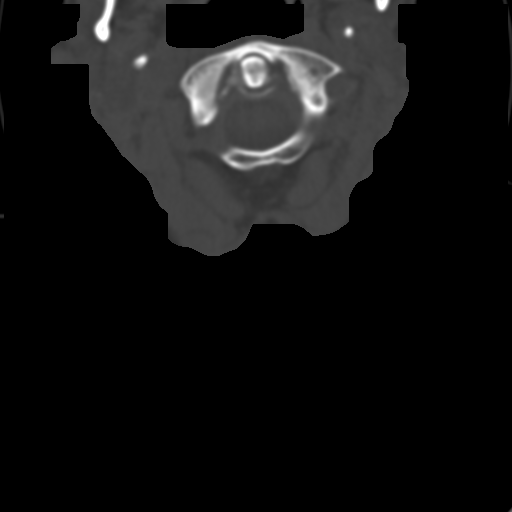

[Series 602: axial reformats · axial · 0.36mm/px · z∈[+950,+1045]mm · 3 of 110 slices shown, 4 images]
[im 28/110  soft-tissue]
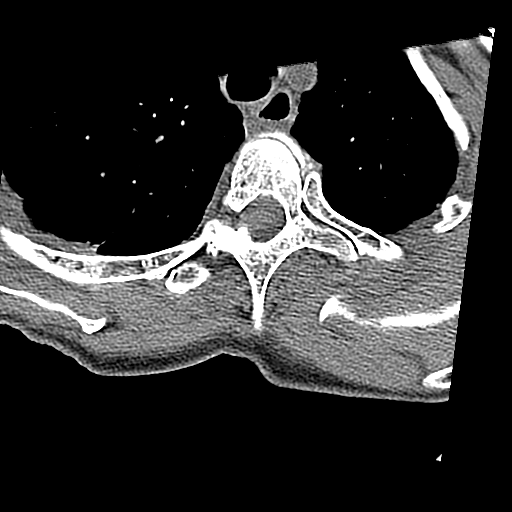
[im 28/110  bone]
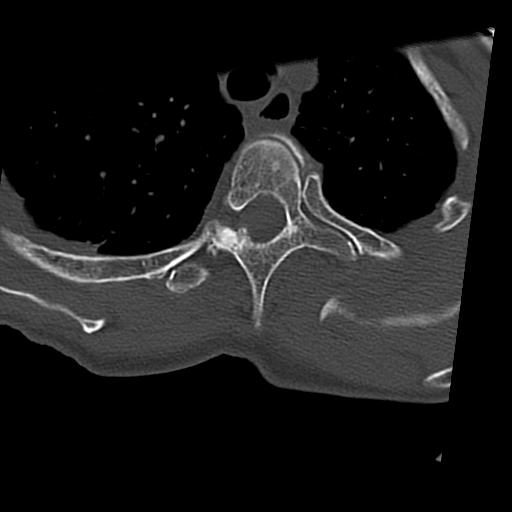
[im 55/110  bone]
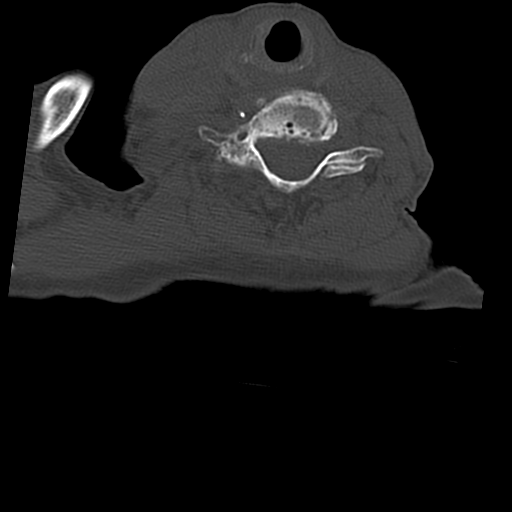
[im 82/110  bone]
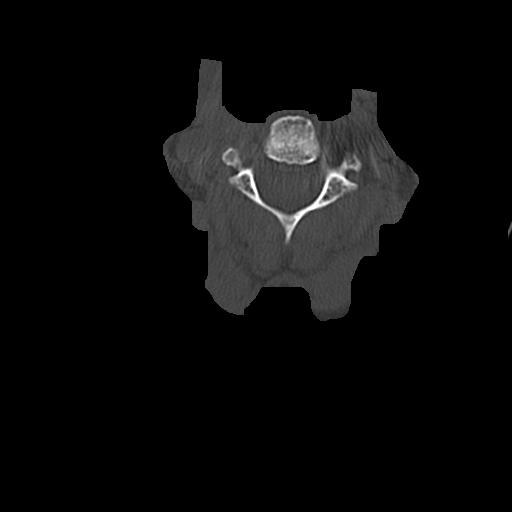

[Series 603: coronal images · coronal · 0.36mm/px · 3 of 56 slices shown]
[im 12/56  bone]
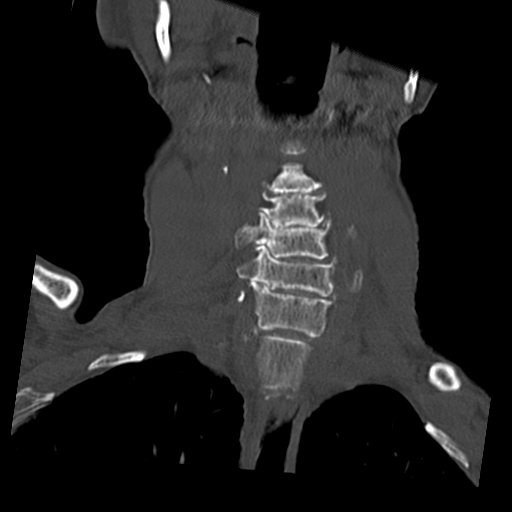
[im 23/56  bone]
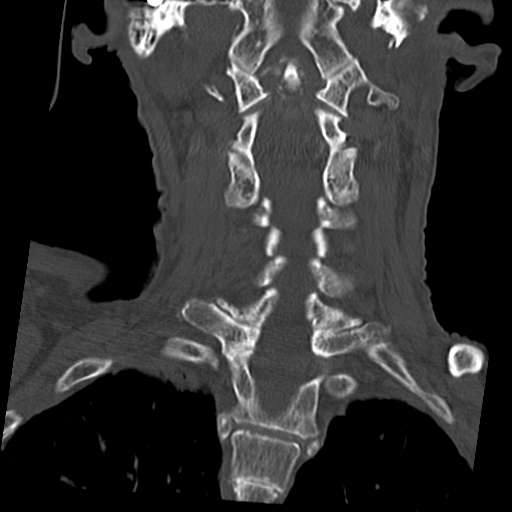
[im 33/56  bone]
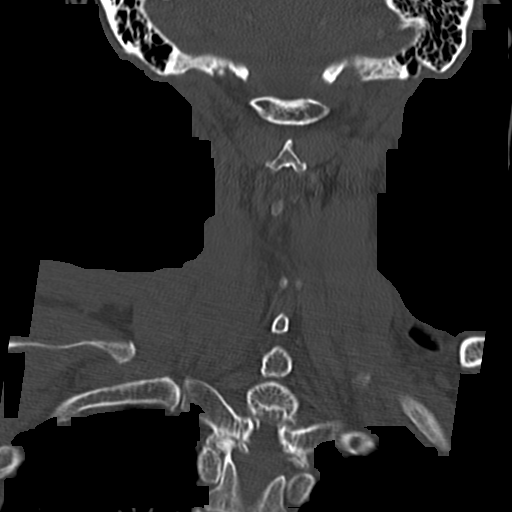

[Series 604: sagittal images · sagittal · 0.36mm/px · 5 of 44 slices shown, 6 images]
[im 15/44  bone]
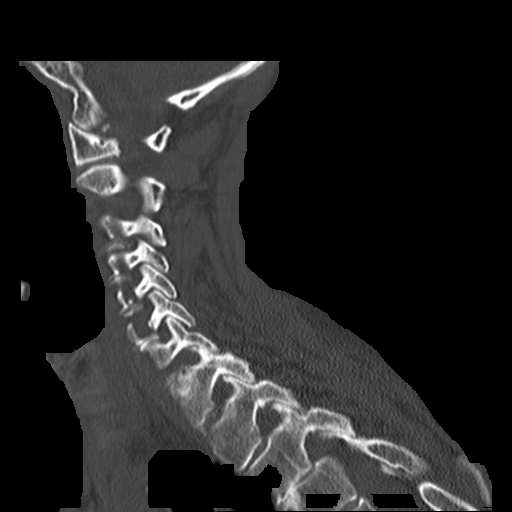
[im 18/44  bone]
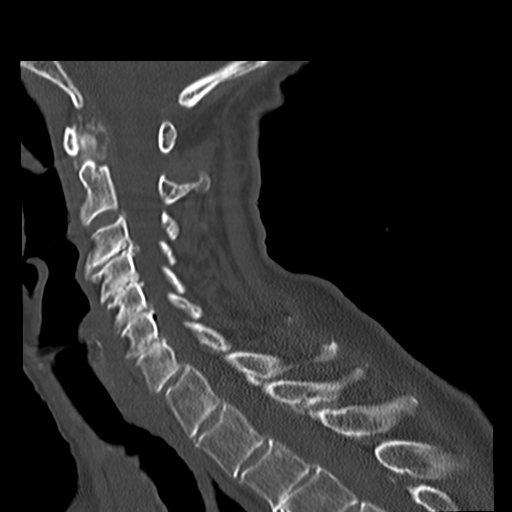
[im 22/44  soft-tissue]
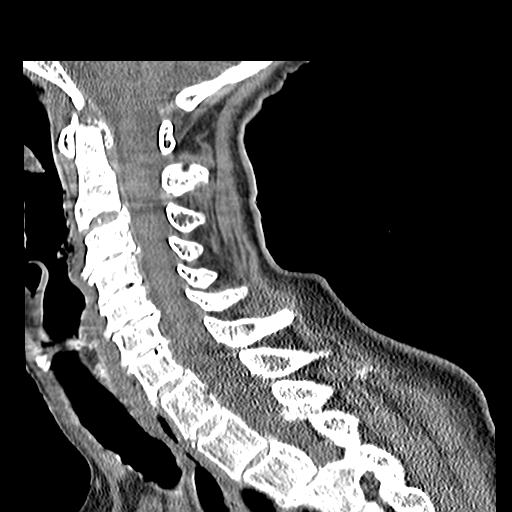
[im 22/44  bone]
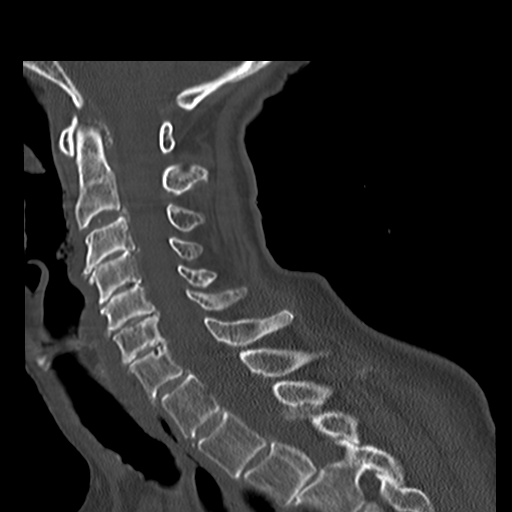
[im 26/44  bone]
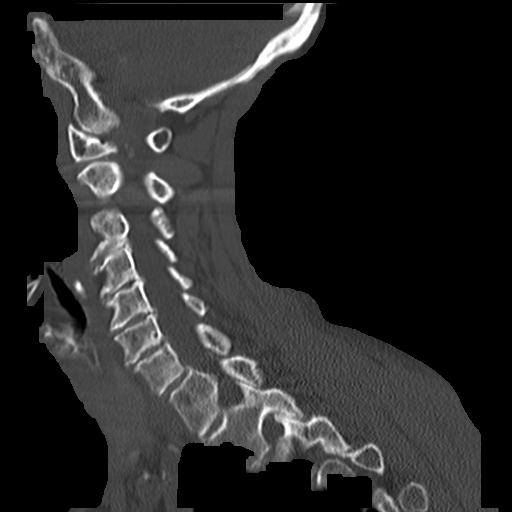
[im 29/44  bone]
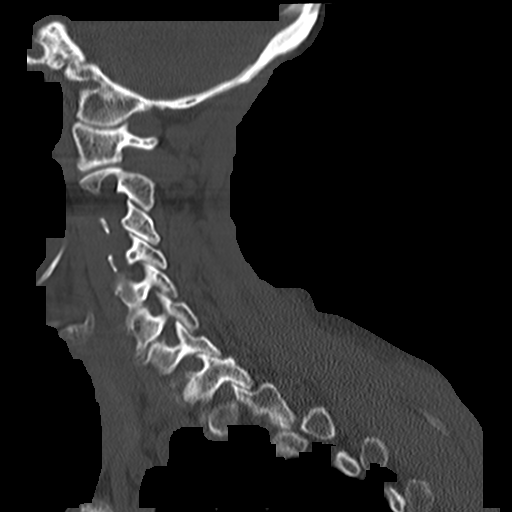

[14 of 33 positions shown; findings below may reference images not displayed]

FINDINGS: CT HEAD FINDINGS

Diffusely enlarged ventricles and subarachnoid spaces. Patchy white
matter low density in both cerebral hemispheres. Stable small amount
of calcification within an area of low density in the left frontal
white matter. No skull fracture, intracranial hemorrhage or
paranasal sinus air-fluid levels.

CT CERVICAL SPINE FINDINGS

Mild levoconvex scoliosis. Multilevel degenerative changes. Mild
partially calcified pannus around the dens. No prevertebral soft
tissue swelling, fractures or subluxations. Bullous changes in both
upper lobes with biapical pleural and parenchymal scarring, greater
on the right. Bilateral carotid artery calcifications.
IMPRESSION: 1. No skull fracture or intracranial hemorrhage.
2. No cervical spine fracture or subluxation.
3. Stable diffuse cerebral and cerebellar atrophy.
4. Mildly progressive chronic small vessel white matter ischemic
changes in both cerebral hemispheres.
5. Stable left frontal lobe dystrophic calcification.
6. Bilateral carotid artery atheromatous calcifications.
7. COPD.
